# Patient Record
Sex: Male | Born: 1960
Health system: Southern US, Community
[De-identification: ages and names within clinical notes are randomized; demographics above are authoritative.]

## PROBLEM LIST (undated history)

## (undated) DIAGNOSIS — F40240 Claustrophobia: Secondary | ICD-10-CM

## (undated) DIAGNOSIS — F411 Generalized anxiety disorder: Secondary | ICD-10-CM

## (undated) DIAGNOSIS — M169 Osteoarthritis of hip, unspecified: Secondary | ICD-10-CM

## (undated) HISTORY — DX: Claustrophobia: F40.240

## (undated) HISTORY — DX: Osteoarthritis of hip, unspecified: M16.9

## (undated) HISTORY — DX: Generalized anxiety disorder: F41.1

---

## 2004-01-14 ENCOUNTER — Ambulatory Visit: Payer: Self-pay | Admitting: Internal Medicine

## 2004-01-21 ENCOUNTER — Ambulatory Visit: Payer: Self-pay | Admitting: Internal Medicine

## 2004-07-16 ENCOUNTER — Ambulatory Visit: Payer: Self-pay | Admitting: Internal Medicine

## 2004-12-01 ENCOUNTER — Ambulatory Visit: Payer: Self-pay | Admitting: Internal Medicine

## 2006-06-10 ENCOUNTER — Ambulatory Visit: Payer: Self-pay | Admitting: Internal Medicine

## 2006-06-17 ENCOUNTER — Ambulatory Visit: Payer: Self-pay | Admitting: Internal Medicine

## 2006-12-09 ENCOUNTER — Ambulatory Visit: Payer: Self-pay | Admitting: Internal Medicine

## 2007-08-21 ENCOUNTER — Telehealth: Payer: Self-pay | Admitting: Internal Medicine

## 2007-09-26 ENCOUNTER — Encounter: Payer: Self-pay | Admitting: Internal Medicine

## 2008-09-03 ENCOUNTER — Telehealth: Payer: Self-pay | Admitting: Internal Medicine

## 2008-12-17 ENCOUNTER — Ambulatory Visit: Payer: Self-pay | Admitting: Internal Medicine

## 2008-12-17 DIAGNOSIS — M1610 Unilateral primary osteoarthritis, unspecified hip: Secondary | ICD-10-CM

## 2008-12-17 DIAGNOSIS — F411 Generalized anxiety disorder: Secondary | ICD-10-CM | POA: Insufficient documentation

## 2008-12-17 DIAGNOSIS — M169 Osteoarthritis of hip, unspecified: Secondary | ICD-10-CM | POA: Insufficient documentation

## 2008-12-17 HISTORY — DX: Osteoarthritis of hip, unspecified: M16.9

## 2008-12-17 HISTORY — DX: Unilateral primary osteoarthritis, unspecified hip: M16.10

## 2008-12-17 HISTORY — DX: Generalized anxiety disorder: F41.1

## 2008-12-19 ENCOUNTER — Encounter (INDEPENDENT_AMBULATORY_CARE_PROVIDER_SITE_OTHER): Payer: Self-pay | Admitting: *Deleted

## 2009-01-20 ENCOUNTER — Encounter: Payer: Self-pay | Admitting: Internal Medicine

## 2009-02-19 ENCOUNTER — Telehealth: Payer: Self-pay | Admitting: Internal Medicine

## 2009-02-19 ENCOUNTER — Ambulatory Visit: Payer: Self-pay | Admitting: Internal Medicine

## 2009-02-19 LAB — CONVERTED CEMR LAB: Vit D, 25-Hydroxy: 33 ng/mL (ref 30–89)

## 2009-02-20 ENCOUNTER — Ambulatory Visit: Payer: Self-pay | Admitting: Internal Medicine

## 2009-04-10 ENCOUNTER — Encounter: Payer: Self-pay | Admitting: Internal Medicine

## 2009-12-01 ENCOUNTER — Telehealth: Payer: Self-pay | Admitting: Internal Medicine

## 2009-12-10 ENCOUNTER — Ambulatory Visit: Payer: Self-pay | Admitting: Internal Medicine

## 2010-03-08 LAB — CONVERTED CEMR LAB
ALT: 22 units/L (ref 0–40)
ALT: 23 units/L (ref 0–53)
AST: 24 units/L (ref 0–37)
AST: 27 units/L (ref 0–37)
Albumin: 4.1 g/dL (ref 3.5–5.2)
Albumin: 4.2 g/dL (ref 3.5–5.2)
Alkaline Phosphatase: 50 units/L (ref 39–117)
Alkaline Phosphatase: 53 units/L (ref 39–117)
BUN: 11 mg/dL (ref 6–23)
BUN: 6 mg/dL (ref 6–23)
Basophils Absolute: 0 10*3/uL (ref 0.0–0.1)
Basophils Absolute: 0 10*3/uL (ref 0.0–0.1)
Basophils Relative: 0.2 % (ref 0.0–1.0)
Basophils Relative: 0.4 % (ref 0.0–3.0)
Bilirubin, Direct: 0 mg/dL (ref 0.0–0.3)
Bilirubin, Direct: 0.1 mg/dL (ref 0.0–0.3)
CO2: 30 meq/L (ref 19–32)
CO2: 32 meq/L (ref 19–32)
Calcium: 9.3 mg/dL (ref 8.4–10.5)
Calcium: 9.5 mg/dL (ref 8.4–10.5)
Chloride: 105 meq/L (ref 96–112)
Chloride: 107 meq/L (ref 96–112)
Cholesterol: 154 mg/dL (ref 0–200)
Cholesterol: 192 mg/dL (ref 0–200)
Creatinine, Ser: 0.9 mg/dL (ref 0.4–1.5)
Creatinine, Ser: 1 mg/dL (ref 0.4–1.5)
Eosinophils Absolute: 0.1 10*3/uL (ref 0.0–0.7)
Eosinophils Absolute: 0.2 10*3/uL (ref 0.0–0.6)
Eosinophils Relative: 1.5 % (ref 0.0–5.0)
Eosinophils Relative: 3.8 % (ref 0.0–5.0)
GFR calc Af Amer: 103 mL/min
GFR calc non Af Amer: 85 mL/min
GFR calc non Af Amer: 94.95 mL/min (ref >60)
Glucose, Bld: 80 mg/dL (ref 70–99)
Glucose, Bld: 92 mg/dL (ref 70–99)
HCT: 41.6 % (ref 39.0–52.0)
HCT: 41.9 % (ref 39.0–52.0)
HDL: 32.5 mg/dL — ABNORMAL LOW (ref >39.00)
HDL: 44.2 mg/dL (ref >39.0)
Hemoglobin: 14.2 g/dL (ref 13.0–17.0)
Hemoglobin: 14.4 g/dL (ref 13.0–17.0)
INR: 1.1 — ABNORMAL HIGH (ref 0.8–1.0)
LDL Cholesterol: 111 mg/dL — ABNORMAL HIGH (ref 0–99)
LDL Cholesterol: 129 mg/dL — ABNORMAL HIGH (ref 0–99)
Lymphocytes Relative: 32.8 % (ref 12.0–46.0)
Lymphocytes Relative: 33.1 % (ref 12.0–46.0)
Lymphs Abs: 1.8 10*3/uL (ref 0.7–4.0)
MCHC: 33.9 g/dL (ref 30.0–36.0)
MCHC: 34.6 g/dL (ref 30.0–36.0)
MCV: 85.5 fL (ref 78.0–100.0)
MCV: 90.3 fL (ref 78.0–100.0)
Monocytes Absolute: 0.6 10*3/uL (ref 0.1–1.0)
Monocytes Absolute: 0.7 10*3/uL (ref 0.2–0.7)
Monocytes Relative: 10.8 % (ref 3.0–12.0)
Monocytes Relative: 10.9 % (ref 3.0–11.0)
Neutro Abs: 3 10*3/uL (ref 1.4–7.7)
Neutro Abs: 3.4 10*3/uL (ref 1.4–7.7)
Neutrophils Relative %: 52 % (ref 43.0–77.0)
Neutrophils Relative %: 54.5 % (ref 43.0–77.0)
Platelets: 214 10*3/uL (ref 150.0–400.0)
Platelets: 253 10*3/uL (ref 150–400)
Potassium: 4 meq/L (ref 3.5–5.1)
Potassium: 4.3 meq/L (ref 3.5–5.1)
Prothrombin Time: 11.1 s (ref 9.1–11.7)
RBC: 4.63 M/uL (ref 4.22–5.81)
RBC: 4.86 M/uL (ref 4.22–5.81)
RDW: 12.5 % (ref 11.5–14.6)
RDW: 12.9 % (ref 11.5–14.6)
Sodium: 144 meq/L (ref 135–145)
Sodium: 146 meq/L — ABNORMAL HIGH (ref 135–145)
TSH: 3.2 microintl units/mL (ref 0.35–5.50)
TSH: 3.35 microintl units/mL (ref 0.35–5.50)
Total Bilirubin: 0.9 mg/dL (ref 0.3–1.2)
Total Bilirubin: 1.2 mg/dL (ref 0.3–1.2)
Total CHOL/HDL Ratio: 4.3
Total CHOL/HDL Ratio: 5
Total Protein: 7.1 g/dL (ref 6.0–8.3)
Total Protein: 7.6 g/dL (ref 6.0–8.3)
Triglycerides: 55 mg/dL (ref 0.0–149.0)
Triglycerides: 94 mg/dL (ref 0–149)
VLDL: 11 mg/dL (ref 0.0–40.0)
VLDL: 19 mg/dL (ref 0–40)
WBC: 5.5 10*3/uL (ref 4.5–10.5)
WBC: 6.5 10*3/uL (ref 4.5–10.5)
aPTT: 28.9 s — ABNORMAL HIGH (ref 21.7–28.8)

## 2010-03-10 NOTE — Letter (Signed)
Summary: [facility]   Imported By: Maryln Gottron 04/22/2009 12:29:27  _____________________________________________________________________  External Attachment:    Type:   Image     Comment:   External Document

## 2010-03-10 NOTE — Progress Notes (Signed)
Summary: question about wife  Phone Note Call from Patient Call back at Home Phone [phone]   Caller: husband,Cormick Summary of Call: Would you see wife, Sergio Freeman, [birth year], for elevated liver test done for life insurance or your suggestion?  He thought you were also her PCP. Initial call taken by: Rudy Jew, RN,  December 01, 2009 8:34 AM  Follow-up for Phone Call        ok next available Follow-up by: Birdie Sons MD,  December 01, 2009 3:52 PM  Additional Follow-up for Phone Call Additional follow up Details #1::        Left message to inform.   Additional Follow-up by: Rudy Jew, RN,  December 01, 2009 4:33 PM

## 2010-03-10 NOTE — Procedures (Signed)
Summary: Colonoscopy Report/[facility]  Colonoscopy Report/[facility]   Imported By: Maryln Gottron 12/19/2009 13:47:26  _____________________________________________________________________  External Attachment:    Type:   Image     Comment:   External Document

## 2010-03-10 NOTE — Progress Notes (Signed)
Summary: questions regarding Advil?  Phone Note Call from Patient   Caller: Patient Call For: Dr Cato Mulligan Summary of Call: Pt. saw a MD for his osteoarthritis of hip, and was advised to take Advil 3 in the am and 3 at lunch.....Marland KitchenMarland KitchenHe wants to know if you feel this is too much Advil or if it would be ok to take this amount of Advil. [phone] Initial call taken by: Lynann Beaver CMA,  August 21, 2007 2:37 PM  Follow-up for Phone Call        ok to take as prescribed by ortho Follow-up by: Birdie Sons MD,  August 21, 2007 3:41 PM  Additional Follow-up for Phone Call Additional follow up Details #1::        Give Dr. Marliss Coots recommendations. Additional Follow-up by: Lynann Beaver CMA,  August 21, 2007 3:46 PM

## 2010-03-10 NOTE — Assessment & Plan Note (Signed)
Summary: LOWER ABD PAIN/RCD   Vital Signs:  Patient profile:   50 year old male Height:      70 inches Weight:      179 pounds BMI:     25.78 Temp:     99.1 degrees F oral BP sitting:   164 / 102  (left arm) Cuff size:   regular  Vitals Entered By: Alfred Levins, CMA (December 10, 2009 8:35 AM)  Serial Vital Signs/Assessments:  Time      Position  BP       Pulse  Resp  Temp     By                     138/82                         Birdie Sons MD  CC: lower abd pain x4 days, no nausea   CC:  lower abd pain x4 days and no nausea.  History of Present Illness: Bilateral lower quadrant pain.  Crampy abdominal pain for 4-5 days BMS ok but less than usual no diarrhea.no nausea/emesis no fever, chills,  no unusual foods but was in [place] --- came back 11/29/09. Was staying at a resort.   Appetite has been diminished.   Current Medications (verified): 1)  None  Allergies (verified): No Known Drug Allergies  Past History:  Past Medical History: Last updated: 12/17/2008 Unremarkable Anxiety---situational claustrophobia  Past Surgical History: Last updated: 12/17/2008 Denies surgical history  Family History: Last updated: 12/17/2008 father---alive, MI, CHF mother healthy  Social History: Last updated: 12/17/2008 Occupation: Married Never Smoked Regular exercise-yes  Risk Factors: Exercise: yes (12/17/2008)  Risk Factors: Smoking Status: never (12/17/2008)  Physical Exam  General:  well-developed well-nourished male in no acute distress. HEENT exam atraumatic, normocephalic symmetric her muscles are intact. No icterus. Neck is supple. Chest clear to auscultation cardiac exam S1-S2 are regular. Abdominal exam active bowel sounds, soft. Nondistended. Tenderness to palpation in the left lower quadrant. No rebound or guarding. Extremities with no clubbing cyanosis or edema.   Impression & Recommendations:  Problem # 1:  DIVERTICULITIS, COLON, NOS  (ICD-562.11)  based on history exam unconvinced the patient has diverticulitis. Will treat as such. He'll take Cipro and metronidazole. Side effects discussed. He'll avoid alcohol. He'll call me if he develops any fever, chills worsening pain. Natural course of diverticulitis discussed. Worsening symptoms of diverticulitis discussed.  Complete Medication List: 1)  Cipro 500 Mg Tabs (Ciprofloxacin hcl) .Marland Kitchen.. 1 by mouth 2 times daily 2)  Metronidazole 500 Mg Tabs (Metronidazole) .... Take 1 tablet by mouth three times a day  Patient Instructions: 1)  no alcohol while taking metronidazole Prescriptions: METRONIDAZOLE 500 MG  TABS (METRONIDAZOLE) Take 1 tablet by mouth three times a day  #15 x 0   Entered and Authorized by:   Birdie Sons MD   Signed by:   Birdie Sons MD on 12/10/2009   Method used:   Electronically to        [facility] [phone]* (retail)       [address] [place], [place]  [address]       Ph: [phone]       Fax: [phone]   RxID:   [id number] CIPRO 500 MG TABS (CIPROFLOXACIN HCL) 1 by mouth 2 times daily  #14 x 0   Entered and Authorized by:   Smitty Cords  Swords MD   Signed by:   Birdie Sons MD on 12/10/2009   Method used:   Electronically to        [facility] # [phone]* (retail)       [address] [place], [place]  [address]       Ph: [phone]       Fax: [phone]   RxID:   [id number]    Orders Added: 1)  Est. Patient Level IV [244[address]]

## 2010-03-10 NOTE — Progress Notes (Signed)
  Phone Note Call from Patient   Caller: Patient Call For: Birdie Sons MD Summary of Call: Pt and his son are going on a cruise, and would like to have #4 Transderm Scop Patches. [facility] [phone] Initial call taken by: Lynann Beaver CMA,  September 03, 2008 11:11 AM  Follow-up for Phone Call        ok, change every 3 days Follow-up by: Birdie Sons MD,  September 04, 2008 8:22 AM    New/Updated Medications: TRANSDERM-SCOP 1.5 MG PT72 (SCOPOLAMINE BASE) apply q 3 days Prescriptions: TRANSDERM-SCOP 1.5 MG PT72 (SCOPOLAMINE BASE) apply q 3 days  #4 x 0   Entered by:   Lynann Beaver CMA   Authorized by:   Birdie Sons MD   Signed by:   Lynann Beaver CMA on 09/04/2008   Method used:   [id number] ([facility]ail[facility]       [address]       [place], [place]  [address]       Ph: [phone] or [phone]       Fax: [phone]   RxID:   [id number] TRANSDERM-SCOP 1.5 MG PT72 (SCOPOLAMINE BASE) apply q 3 days  #4 x 0   Entered by:   Lynann Beaver CMA   Authorized by:   Birdie Sons MD   Signed by:   Lynann Beaver CMA on 09/04/2008   Method used:   Print then Give to Patient   RxID:   [id number]  Pt. notified.

## 2010-03-10 NOTE — Consult Note (Signed)
[facility]   Imported By: Kassie Mends 10/05/2007 14:18:15  _____________________________________________________________________  External Attachment:    Type:   Image     Comment:   [facility]

## 2010-03-10 NOTE — Letter (Signed)
Summary: Previsit letter  [facility]  [address] [place], [place] [address]   Phone: [phone]  Fax: [phone]       12/19/2008 MRN: [id number]  [facility] [address] [place], [place]  [address]  Dear Sergio Freeman,  Welcome to the Gastroenterology Division at [facility].    You are scheduled to see a nurse for your pre-procedure visit on 01/01/2009 at 8:30PM on the 3rd floor at [facility], [address]  We ask that you try to arrive at our office 15 minutes prior to your appointment time to allow for check-in.  Your nurse visit will consist of discussing your medical and surgical history, your immediate family medical history, and your medications.    Please bring a complete list of all your medications or, if you prefer, bring the medication bottles and we will list them.  We will need to be aware of both prescribed and over the counter drugs.  We will need to know exact dosage information as well.  If you are on blood thinners (Coumadin, Plavix, Aggrenox, Ticlid, etc.) please call our office today/prior to your appointment, as we need to consult with your physician about holding your medication.   Please be prepared to read and sign documents such as consent forms, a financial agreement, and acknowledgement forms.  If necessary, and with your consent, a friend or relative is welcome to sit-in on the nurse visit with you.  Please bring your insurance card so that we may make a copy of it.  If your insurance requires a referral to see a specialist, please bring your referral form from your primary care physician.  No co-pay is required for this nurse visit.     If you cannot keep your appointment, please call [phone] to cancel or reschedule prior to your appointment date.  This allows [place] the opportunity to schedule an appointment for another patient in need of care.    Thank you for choosing [facility] for your medical  needs.  We appreciate the opportunity to care for you.  Please visit [place] at our website  to learn more about our practice.                     Sincerely.                                                                                                                   The Gastroenterology Division

## 2010-03-10 NOTE — Assessment & Plan Note (Signed)
Summary: MEDICAL CLEARANCE FOR HIP SURGERY/NJR/discuss colonoscopy ref...   Vital Signs:  Patient profile:   50 year old male Weight:      176 pounds Temp:     98.2 degrees F Pulse rate:   64 / minute Resp:     12 per minute BP sitting:   118 / 78  (left arm)  Vitals Entered By: Gladis Riffle, RN (December 17, 2008 9:09 AM)   History of Present Illness: preop clearance--- left hip---ongoing pain for years---progressive. Scheduled 02/27/08  All other systems reviewed and were negative   Preventive Screening-Counseling & Management  Alcohol-Tobacco     Smoking Status: never  Caffeine-Diet-Exercise     Does Patient Exercise: yes  Current Problems (verified): None  Current Medications (verified): 1)  None  Allergies (verified): No Known Drug Allergies  Comments:  Nurse/Medical Assistant:   requests cpx, is fastingfor medical clearance for left hip surgery by dr gross on 02/26/09--also wants to discuss colonoscopy  The patient's medications and allergies were reviewed with the patient and were updated in the Medication and Allergy Lists. Gladis Riffle, RN (December 17, 2008 9:11 AM)  Past History:  Past Medical History: Unremarkable Anxiety---situational claustrophobia  Past Surgical History: Denies surgical history  Family History: father---alive, MI, CHF mother healthy  Social History: Occupation: Married Never Smoked Regular exercise-yes Smoking Status:  never Does Patient Exercise:  yes  Review of Systems       All other systems reviewed and were negative   Physical Exam  General:  Well-developed,well-nourished,in no acute distress; alert,appropriate and cooperative throughout examination Head:  normocephalic and atraumatic.   Eyes:  pupils equal and pupils round.   Ears:  R ear normal and L ear normal.   Nose:  no external deformity and no external erythema.   Neck:  No deformities, masses, or tenderness noted. Chest Wall:  No deformities,  masses, tenderness or gynecomastia noted. Lungs:  normal respiratory effort and no intercostal retractions.   Heart:  normal rate, regular rhythm, no murmur, and no gallop.   Abdomen:  Bowel sounds positive,abdomen soft and non-tender without masses, organomegaly or hernias noted. Msk:  from ofright hip left hip with decreased internal and external rotation.  Pulses:  R radial normal and L radial normal.   Neurologic:  cranial nerves II-XII intact and gait normal.     Impression & Recommendations:  Problem # 1:  PREOPERATIVE EXAMINATION (ICD-V72.84)  he is at low risk for general anesthesia  Orders: Venipuncture ([address]) TLB-BMP (Basic Metabolic Panel-BMET) (80048-METABOL) TLB-CBC Platelet - w/Differential (85025-CBCD) TLB-Hepatic/Liver Function Pnl (80076-HEPATIC) TLB-TSH (Thyroid Stimulating Hormone) (84443-TSH) TLB-PTT (85730-PTTL) TLB-PT (Protime) (85610-PTP)  Problem # 2:  ANXIETY (ICD-300.00) situational His updated medication list for this problem includes:    Alprazolam 0.5 Mg Tabs (Alprazolam) .Marland Kitchen... 1/2-1 by mouth as needed anxiety  Complete Medication List: 1)  Alprazolam 0.5 Mg Tabs (Alprazolam) .... 1/2-1 by mouth as needed anxiety 2)  Ciclopirox 8 % Soln (Ciclopirox) .... Apply two times a day to affected toe  Other Orders: EKG w/ Interpretation (93000) Gastroenterology Referral (GI) Prescriptions: CICLOPIROX 8 % SOLN (CICLOPIROX) apply two times a day to affected toe  #1 vial x 1   Entered and Authorized by:   Birdie Sons MD   Signed by:   Birdie Sons MD on 12/17/2008   Method used:   [facility]ron[facility] to        [facility] [phone]* (retail)       [phone]  [place], [place]  [address]       Ph: [phone]       Fax: [phone]   RxID:   [id number] ALPRAZOLAM 0.5 MG  TABS (ALPRAZOLAM) 1/2-1 by mouth as needed anxiety  #10 x 0   Entered and Authorized by:   Birdie Sons MD   Signed by:   Birdie Sons MD on 12/17/2008   Method  used:   Print then Give to Patient   RxID:   [id number]   Appended Document: MEDICAL CLEARANCE FOR HIP SURGERY/NJR/discuss colonoscopy ref...   Appended Document: MEDICAL CLEARANCE FOR HIP SURGERY/NJR/discuss colonoscopy ref.Marland KitchenMarland Kitchen

## 2010-03-10 NOTE — Progress Notes (Signed)
Summary: medical clearance  Phone Note Call from Patient   Caller: Patient Call For: Birdie Sons MD Summary of Call: Pt is calling to check on Surgical Clearance.  Wants to pick it up today ASAP as surgeons office states they have called multiple times and have not received. it. [phone] Initial call taken by: Lynann Beaver CMA,  February 19, 2009 12:00 PM  Follow-up for Phone Call        medical records is working on this.  Patient notified.  Follow-up by: Gladis Riffle, RN,  February 19, 2009 12:27 PM  Additional Follow-up for Phone Call Additional follow up Details #1::        Dr Michaell Cowing office says pt must have Vit D level done before surgery.  Left message on pt home phone to call back for lab appt for Vit D level.  He may call me back if any questions. Additional Follow-up by: Gladis Riffle, RN,  February 19, 2009 3:42 PM    Additional Follow-up for Phone Call Additional follow up Details #2::    Vit D sceduled.  Also needs cxr so that was ordered also.Patient notified..  These will need to  be faxed to Dr Michaell Cowing as soon as returned. Follow-up by: Gladis Riffle, RN,  February 19, 2009 4:04 PM  Additional Follow-up for Phone Call Additional follow up Details #3:: Details for Additional Follow-up Action Taken: Patient notified.  Additional Follow-up by: Gladis Riffle, RN,  February 21, 2009 8:01 AM

## 2010-03-10 NOTE — Assessment & Plan Note (Signed)
Summary: GROIN PAIN/MHF    Vital Signs:  Patient Profile:   50 Years Old Male Weight:      174 pounds Temp:     97.6 degrees F oral BP sitting:   146 / 84  Vitals Entered By: Lynann Beaver CMA (December 09, 2006 11:56 AM)                 Chief Complaint:  right groin pain x 3 weeks.  History of Present Illness: exercising a lot has noticed Right testicular pain--can radiate to right groin. has a long history of left hip arthritis---has been tole at some time he will need a hip replacement        Review of Systems       no other complaints in a complete ROS    Physical Exam  General:     Well-developed,well-nourished,in no acute distress; alert,appropriate and cooperative throughout examination Neck:     No deformities, masses, or tenderness noted. Chest Wall:     No deformities, masses, tenderness or gynecomastia noted. Abdomen:     Bowel sounds positive,abdomen soft and non-tender without masses, organomegaly or hernias noted. Genitalia:     no hernia Msk:     from ofright hip left hip with decreased internal and external rotation.     Impression & Recommendations:  Problem # 1:  INGUINAL PAIN, RIGHT (ICD-789.09) no obvious source. he has a remarkably arthritic left hip     ]

## 2010-12-10 ENCOUNTER — Encounter: Payer: Self-pay | Admitting: Internal Medicine

## 2010-12-11 ENCOUNTER — Ambulatory Visit: Payer: Self-pay | Admitting: Family Medicine

## 2010-12-21 ENCOUNTER — Telehealth: Payer: Self-pay | Admitting: *Deleted

## 2010-12-21 NOTE — Telephone Encounter (Signed)
Pt aware.

## 2010-12-21 NOTE — Telephone Encounter (Signed)
Pt was experiencing Tinnitus and numbness.  He went to an ENT and was told he had Meniere's disease.  Pt wants to know if he should come in and see Dr Cato Mulligan or if not, what he should do next

## 2010-12-21 NOTE — Telephone Encounter (Signed)
Not much to do with menniere's---continue f/u with ENT would be best bet

## 2011-01-27 ENCOUNTER — Ambulatory Visit: Payer: Self-pay | Admitting: Internal Medicine

## 2011-01-27 ENCOUNTER — Encounter: Payer: Self-pay | Admitting: Internal Medicine

## 2011-01-27 ENCOUNTER — Ambulatory Visit (INDEPENDENT_AMBULATORY_CARE_PROVIDER_SITE_OTHER): Payer: BC Managed Care – PPO | Admitting: Internal Medicine

## 2011-01-27 VITALS — BP 132/90 | HR 80 | Temp 98.2°F | Ht 70.0 in | Wt 176.0 lb

## 2011-01-27 DIAGNOSIS — H8109 Meniere's disease, unspecified ear: Secondary | ICD-10-CM

## 2011-01-27 DIAGNOSIS — H9319 Tinnitus, unspecified ear: Secondary | ICD-10-CM

## 2011-01-27 DIAGNOSIS — Z0000 Encounter for general adult medical examination without abnormal findings: Secondary | ICD-10-CM

## 2011-01-27 DIAGNOSIS — R42 Dizziness and giddiness: Secondary | ICD-10-CM

## 2011-01-27 DIAGNOSIS — F419 Anxiety disorder, unspecified: Secondary | ICD-10-CM

## 2011-01-27 DIAGNOSIS — F411 Generalized anxiety disorder: Secondary | ICD-10-CM

## 2011-01-27 LAB — CBC WITH DIFFERENTIAL/PLATELET
Basophils Absolute: 0 10*3/uL (ref 0.0–0.1)
Basophils Relative: 0.3 % (ref 0.0–3.0)
Eosinophils Absolute: 0 10*3/uL (ref 0.0–0.7)
Eosinophils Relative: 0.3 % (ref 0.0–5.0)
HCT: 47 % (ref 39.0–52.0)
Hemoglobin: 15.9 g/dL (ref 13.0–17.0)
Lymphocytes Relative: 19.6 % (ref 12.0–46.0)
Lymphs Abs: 1.2 10*3/uL (ref 0.7–4.0)
MCHC: 33.9 g/dL (ref 30.0–36.0)
MCV: 89.7 fl (ref 78.0–100.0)
Monocytes Absolute: 0.8 10*3/uL (ref 0.1–1.0)
Monocytes Relative: 13.3 % — ABNORMAL HIGH (ref 3.0–12.0)
Neutro Abs: 3.9 10*3/uL (ref 1.4–7.7)
Neutrophils Relative %: 66.5 % (ref 43.0–77.0)
Platelets: 215 10*3/uL (ref 150.0–400.0)
RBC: 5.23 Mil/uL (ref 4.22–5.81)
RDW: 14 % (ref 11.5–14.6)
WBC: 5.9 10*3/uL (ref 4.5–10.5)

## 2011-01-27 LAB — LIPID PANEL
Cholesterol: 209 mg/dL — ABNORMAL HIGH (ref 0–200)
HDL: 57 mg/dL (ref >39.00)
Total CHOL/HDL Ratio: 4
Triglycerides: 52 mg/dL (ref 0.0–149.0)
VLDL: 10.4 mg/dL (ref 0.0–40.0)

## 2011-01-27 LAB — HEPATIC FUNCTION PANEL
ALT: 27 U/L (ref 0–53)
AST: 30 U/L (ref 0–37)
Albumin: 4.6 g/dL (ref 3.5–5.2)
Alkaline Phosphatase: 60 U/L (ref 39–117)
Bilirubin, Direct: 0.1 mg/dL (ref 0.0–0.3)
Total Bilirubin: 0.7 mg/dL (ref 0.3–1.2)
Total Protein: 7.8 g/dL (ref 6.0–8.3)

## 2011-01-27 LAB — BASIC METABOLIC PANEL
BUN: 14 mg/dL (ref 6–23)
CO2: 29 mEq/L (ref 19–32)
Calcium: 9.9 mg/dL (ref 8.4–10.5)
Chloride: 105 mEq/L (ref 96–112)
Creatinine, Ser: 1.2 mg/dL (ref 0.4–1.5)
GFR: 67.56 mL/min (ref >60.00)
Glucose, Bld: 86 mg/dL (ref 70–99)
Potassium: 5.5 mEq/L — ABNORMAL HIGH (ref 3.5–5.1)
Sodium: 142 mEq/L (ref 135–145)

## 2011-01-27 LAB — LDL CHOLESTEROL, DIRECT: Direct LDL: 139.2 mg/dL

## 2011-01-27 LAB — TSH: TSH: 2.27 u[IU]/mL (ref 0.35–5.50)

## 2011-01-27 LAB — PSA: PSA: 0.69 ng/mL (ref 0.10–4.00)

## 2011-01-27 MED ORDER — ALPRAZOLAM 0.25 MG PO TABS: 0.2500 mg | ORAL_TABLET | Freq: Every day | ORAL | Status: AC | PRN

## 2011-01-27 NOTE — Progress Notes (Signed)
Patient ID: Sergio Freeman, male   DOB: August 20, 1960, 50 y.o.   MRN: [id number] Meniere's syndrome-- sees ENT.  He has developed a lot of anxiety about the diagnosis of Meniere's syndrome. He has spent a lot of time reading about hearing loss, and vertigo. His sxs are quite mild. He has had 4 episodes of vertigo over the past 3 years.   A/P: Meniere's syndrome Discussed with patient and wife. I have encouraged them not to pursue surgical options. I have tried to reassure them. Total time 20 minutes all counselling

## 2011-02-03 ENCOUNTER — Other Ambulatory Visit: Payer: Self-pay | Admitting: Internal Medicine

## 2011-02-03 ENCOUNTER — Other Ambulatory Visit (INDEPENDENT_AMBULATORY_CARE_PROVIDER_SITE_OTHER): Payer: BC Managed Care – PPO

## 2011-02-03 DIAGNOSIS — H8109 Meniere's disease, unspecified ear: Secondary | ICD-10-CM | POA: Insufficient documentation

## 2011-02-03 DIAGNOSIS — E875 Hyperkalemia: Secondary | ICD-10-CM

## 2011-02-03 LAB — BASIC METABOLIC PANEL
BUN: 11 mg/dL (ref 6–23)
CO2: 31 mEq/L (ref 19–32)
Calcium: 9.1 mg/dL (ref 8.4–10.5)
Chloride: 103 mEq/L (ref 96–112)
Creatinine, Ser: 1 mg/dL (ref 0.4–1.5)
GFR: 84.34 mL/min (ref >60.00)
Glucose, Bld: 139 mg/dL — ABNORMAL HIGH (ref 70–99)
Potassium: 4 mEq/L (ref 3.5–5.1)
Sodium: 141 mEq/L (ref 135–145)

## 2011-02-24 ENCOUNTER — Telehealth: Payer: Self-pay | Admitting: *Deleted

## 2011-02-24 DIAGNOSIS — R739 Hyperglycemia, unspecified: Secondary | ICD-10-CM

## 2011-02-24 NOTE — Telephone Encounter (Signed)
Pt picked up copies of labs and he noticed his gluclose was 139.  He was fasting at time of blood draw.

## 2011-02-24 NOTE — Telephone Encounter (Signed)
Repeat labs in 2 months-- i've placed orders

## 2011-02-25 NOTE — Telephone Encounter (Signed)
Pt aware.

## 2011-03-23 ENCOUNTER — Other Ambulatory Visit: Payer: Self-pay | Admitting: Orthopedic Surgery

## 2011-03-23 DIAGNOSIS — M542 Cervicalgia: Secondary | ICD-10-CM

## 2011-03-29 ENCOUNTER — Other Ambulatory Visit: Payer: BC Managed Care – PPO

## 2011-06-28 ENCOUNTER — Telehealth: Payer: Self-pay | Admitting: Internal Medicine

## 2011-06-28 DIAGNOSIS — Z0000 Encounter for general adult medical examination without abnormal findings: Secondary | ICD-10-CM

## 2011-06-28 NOTE — Telephone Encounter (Signed)
Pt called and said that he never came in to get repeat labs done. Pt said that he is urinating more frequently in the last 3 wks or so. Pt has been off diuretic for 3 months. Pt req call back from nurse. Should pt still come in for repeat labs or should pt get additional labs done?

## 2011-06-28 NOTE — Telephone Encounter (Signed)
Pt should come for labs

## 2011-06-29 NOTE — Telephone Encounter (Signed)
Called pt and schd him for labs tomorrow morning at 9am. Pt is also wanting to get cholesterol lvl checked. Pls order additional lab.

## 2011-06-29 NOTE — Telephone Encounter (Signed)
Future orders placed 

## 2011-06-30 ENCOUNTER — Other Ambulatory Visit (INDEPENDENT_AMBULATORY_CARE_PROVIDER_SITE_OTHER): Payer: BC Managed Care – PPO

## 2011-06-30 DIAGNOSIS — R7309 Other abnormal glucose: Secondary | ICD-10-CM

## 2011-06-30 DIAGNOSIS — Z0000 Encounter for general adult medical examination without abnormal findings: Secondary | ICD-10-CM

## 2011-06-30 DIAGNOSIS — E785 Hyperlipidemia, unspecified: Secondary | ICD-10-CM

## 2011-06-30 DIAGNOSIS — Z13220 Encounter for screening for lipoid disorders: Secondary | ICD-10-CM

## 2011-06-30 DIAGNOSIS — R739 Hyperglycemia, unspecified: Secondary | ICD-10-CM

## 2011-06-30 LAB — LIPID PANEL
Cholesterol: 220 mg/dL — ABNORMAL HIGH (ref 0–200)
HDL: 50.4 mg/dL (ref >39.00)
Total CHOL/HDL Ratio: 4
Triglycerides: 98 mg/dL (ref 0.0–149.0)
VLDL: 19.6 mg/dL (ref 0.0–40.0)

## 2011-06-30 LAB — BASIC METABOLIC PANEL
BUN: 14 mg/dL (ref 6–23)
CO2: 29 mEq/L (ref 19–32)
Calcium: 9.5 mg/dL (ref 8.4–10.5)
Chloride: 105 mEq/L (ref 96–112)
Creatinine, Ser: 1 mg/dL (ref 0.4–1.5)
GFR: 87.26 mL/min (ref >60.00)
Glucose, Bld: 89 mg/dL (ref 70–99)
Potassium: 4.1 mEq/L (ref 3.5–5.1)
Sodium: 141 mEq/L (ref 135–145)

## 2011-06-30 LAB — HEPATIC FUNCTION PANEL
ALT: 23 U/L (ref 0–53)
AST: 21 U/L (ref 0–37)
Albumin: 4.2 g/dL (ref 3.5–5.2)
Alkaline Phosphatase: 50 U/L (ref 39–117)
Bilirubin, Direct: 0 mg/dL (ref 0.0–0.3)
Total Bilirubin: 0.8 mg/dL (ref 0.3–1.2)
Total Protein: 7.4 g/dL (ref 6.0–8.3)

## 2011-06-30 LAB — HEMOGLOBIN A1C: Hgb A1c MFr Bld: 5.8 % (ref 4.6–6.5)

## 2011-06-30 LAB — LDL CHOLESTEROL, DIRECT: Direct LDL: 157.5 mg/dL

## 2011-07-07 ENCOUNTER — Telehealth: Payer: Self-pay | Admitting: Internal Medicine

## 2011-07-07 NOTE — Telephone Encounter (Signed)
Pt needs blood work results °

## 2011-07-07 NOTE — Telephone Encounter (Signed)
Pt is having freq urination.  Put pt on lab schedule for tomorrow.

## 2011-07-08 ENCOUNTER — Other Ambulatory Visit (INDEPENDENT_AMBULATORY_CARE_PROVIDER_SITE_OTHER): Payer: BC Managed Care – PPO

## 2011-07-08 DIAGNOSIS — R350 Frequency of micturition: Secondary | ICD-10-CM

## 2011-07-08 LAB — POCT URINALYSIS DIPSTICK
Bilirubin, UA: NEGATIVE
Blood, UA: NEGATIVE
Glucose, UA: NEGATIVE
Ketones, UA: NEGATIVE
Leukocytes, UA: NEGATIVE
Nitrite, UA: NEGATIVE
Protein, UA: NEGATIVE
Spec Grav, UA: 1.02
Urobilinogen, UA: 0.2
pH, UA: 6

## 2011-07-10 LAB — URINE CULTURE
Colony Count: NO GROWTH
Organism ID, Bacteria: NO GROWTH

## 2011-09-30 ENCOUNTER — Other Ambulatory Visit: Payer: Self-pay | Admitting: Chiropractic Medicine

## 2011-09-30 DIAGNOSIS — M542 Cervicalgia: Secondary | ICD-10-CM

## 2011-10-04 ENCOUNTER — Other Ambulatory Visit: Payer: BC Managed Care – PPO

## 2012-02-25 ENCOUNTER — Encounter: Payer: Self-pay | Admitting: Family Medicine

## 2012-02-25 ENCOUNTER — Ambulatory Visit (INDEPENDENT_AMBULATORY_CARE_PROVIDER_SITE_OTHER): Payer: BC Managed Care – PPO | Admitting: Family Medicine

## 2012-02-25 VITALS — BP 130/90 | Temp 98.3°F | Wt 188.0 lb

## 2012-02-25 DIAGNOSIS — R05 Cough: Secondary | ICD-10-CM

## 2012-02-25 DIAGNOSIS — R059 Cough, unspecified: Secondary | ICD-10-CM

## 2012-02-25 MED ORDER — AZITHROMYCIN 250 MG PO TABS: ORAL_TABLET | ORAL | Status: AC

## 2012-02-25 MED ORDER — HYDROCOD POLST-CHLORPHEN POLST 10-8 MG/5ML PO LQCR: 5.0000 mL | Freq: Two times a day (BID) | ORAL | Status: AC | PRN

## 2012-02-25 NOTE — Patient Instructions (Addendum)
Call in one week if cough no better

## 2012-02-25 NOTE — Progress Notes (Signed)
  Subjective:    Patient ID: Sergio Freeman, male    DOB: [birth year], 52 y.o.   MRN: [id number]  HPI  Nonsmoker seen with one month history of cough. Cough is mostly nonproductive. He's had some intermittent sore throat and fatigue. No appetite or weight changes. No GERD symptoms. No wheezing. No postnasal drip symptoms. No hemoptysis. No dyspnea. Has taken NyQuil without much relief. Denies facial pain or any colored nasal secretions other than rare yellowish nasal discharge. Cough is most bothersome at night.   Review of Systems  Constitutional: Negative for fever, chills and unexpected weight change.  HENT: Positive for voice change (mild laryngitis past couple of weeks) and postnasal drip (mild). Negative for congestion, sore throat and trouble swallowing.   Respiratory: Positive for cough. Negative for shortness of breath and wheezing.   Cardiovascular: Negative for chest pain and leg swelling.  Neurological: Negative for headaches.       Objective:   Physical Exam  Constitutional: He appears well-developed and well-nourished.  HENT:  Right Ear: External ear normal.  Left Ear: External ear normal.  Mouth/Throat: Oropharynx is clear and moist.  Neck: Neck supple.  Cardiovascular: Normal rate and regular rhythm.   Pulmonary/Chest: Effort normal and breath sounds normal. No respiratory distress. He has no wheezes. He has no rales.  Lymphadenopathy:    He has no cervical adenopathy.          Assessment & Plan:  Persistent cough. No evidence for reactive airway disease. Doubt acute/chronic sinusitis. No obvious GERD symptoms. Suspect post viral bronchitis cough. No ACE inhibitor use. No worrisome symptoms such as weight loss, hemoptysis, or dyspnea. Trial of cough suppressant with Tussionex 1 teaspoon each bedtime. Doubt atypical infection but if not improving by next week consider Zithromax. Touch base one week if not resolving

## 2015-07-10 DIAGNOSIS — L570 Actinic keratosis: Secondary | ICD-10-CM | POA: Diagnosis not present

## 2015-07-10 DIAGNOSIS — D225 Melanocytic nevi of trunk: Secondary | ICD-10-CM | POA: Diagnosis not present

## 2015-07-10 DIAGNOSIS — D1801 Hemangioma of skin and subcutaneous tissue: Secondary | ICD-10-CM | POA: Diagnosis not present

## 2015-07-10 DIAGNOSIS — D485 Neoplasm of uncertain behavior of skin: Secondary | ICD-10-CM | POA: Diagnosis not present

## 2015-08-21 DIAGNOSIS — E785 Hyperlipidemia, unspecified: Secondary | ICD-10-CM | POA: Diagnosis not present

## 2015-08-21 DIAGNOSIS — Z0000 Encounter for general adult medical examination without abnormal findings: Secondary | ICD-10-CM | POA: Diagnosis not present

## 2015-08-21 DIAGNOSIS — Z125 Encounter for screening for malignant neoplasm of prostate: Secondary | ICD-10-CM | POA: Diagnosis not present

## 2015-12-24 DIAGNOSIS — M791 Myalgia: Secondary | ICD-10-CM | POA: Diagnosis not present

## 2015-12-24 DIAGNOSIS — M542 Cervicalgia: Secondary | ICD-10-CM | POA: Diagnosis not present

## 2016-01-07 ENCOUNTER — Other Ambulatory Visit: Payer: Self-pay | Admitting: Neurology

## 2016-01-07 ENCOUNTER — Ambulatory Visit
Admission: RE | Admit: 2016-01-07 | Discharge: 2016-01-07 | Disposition: A | Discharge status: Home / Self Care | Payer: BLUE CROSS/BLUE SHIELD | Source: Ambulatory Visit | Attending: Neurology | Admitting: Neurology

## 2016-01-07 DIAGNOSIS — M542 Cervicalgia: Secondary | ICD-10-CM

## 2016-01-07 DIAGNOSIS — S199XXA Unspecified injury of neck, initial encounter: Secondary | ICD-10-CM | POA: Diagnosis not present

## 2016-01-08 ENCOUNTER — Other Ambulatory Visit: Payer: Self-pay | Admitting: Neurology

## 2016-01-08 DIAGNOSIS — I739 Peripheral vascular disease, unspecified: Principal | ICD-10-CM

## 2016-01-08 DIAGNOSIS — I779 Disorder of arteries and arterioles, unspecified: Secondary | ICD-10-CM

## 2016-01-15 ENCOUNTER — Ambulatory Visit
Admission: RE | Admit: 2016-01-15 | Discharge: 2016-01-15 | Disposition: A | Discharge status: Home / Self Care | Payer: BLUE CROSS/BLUE SHIELD | Source: Ambulatory Visit | Attending: Neurology | Admitting: Neurology

## 2016-01-15 DIAGNOSIS — I739 Peripheral vascular disease, unspecified: Principal | ICD-10-CM

## 2016-01-15 DIAGNOSIS — I6523 Occlusion and stenosis of bilateral carotid arteries: Secondary | ICD-10-CM | POA: Diagnosis not present

## 2016-01-15 DIAGNOSIS — I779 Disorder of arteries and arterioles, unspecified: Secondary | ICD-10-CM

## 2016-03-03 ENCOUNTER — Other Ambulatory Visit: Payer: Self-pay | Admitting: Internal Medicine

## 2016-03-03 DIAGNOSIS — Z8249 Family history of ischemic heart disease and other diseases of the circulatory system: Secondary | ICD-10-CM | POA: Diagnosis not present

## 2016-03-03 DIAGNOSIS — R030 Elevated blood-pressure reading, without diagnosis of hypertension: Secondary | ICD-10-CM | POA: Diagnosis not present

## 2016-03-03 DIAGNOSIS — E7800 Pure hypercholesterolemia, unspecified: Secondary | ICD-10-CM | POA: Diagnosis not present

## 2016-03-04 DIAGNOSIS — Z8249 Family history of ischemic heart disease and other diseases of the circulatory system: Secondary | ICD-10-CM | POA: Diagnosis not present

## 2016-03-04 DIAGNOSIS — R030 Elevated blood-pressure reading, without diagnosis of hypertension: Secondary | ICD-10-CM | POA: Diagnosis not present

## 2016-03-04 DIAGNOSIS — E7800 Pure hypercholesterolemia, unspecified: Secondary | ICD-10-CM | POA: Diagnosis not present

## 2016-03-09 ENCOUNTER — Ambulatory Visit
Admission: RE | Admit: 2016-03-09 | Discharge: 2016-03-09 | Disposition: A | Discharge status: Home / Self Care | Payer: No Typology Code available for payment source | Source: Ambulatory Visit | Attending: Internal Medicine | Admitting: Internal Medicine

## 2016-03-09 DIAGNOSIS — Z8249 Family history of ischemic heart disease and other diseases of the circulatory system: Secondary | ICD-10-CM

## 2016-04-28 DIAGNOSIS — M79604 Pain in right leg: Secondary | ICD-10-CM | POA: Diagnosis not present

## 2016-05-10 ENCOUNTER — Other Ambulatory Visit: Payer: Self-pay | Admitting: Internal Medicine

## 2016-05-10 DIAGNOSIS — M545 Low back pain: Secondary | ICD-10-CM

## 2016-05-12 ENCOUNTER — Other Ambulatory Visit (HOSPITAL_COMMUNITY): Payer: Self-pay | Admitting: Internal Medicine

## 2016-05-12 DIAGNOSIS — M545 Low back pain: Secondary | ICD-10-CM

## 2016-05-13 ENCOUNTER — Ambulatory Visit (HOSPITAL_COMMUNITY)
Admission: RE | Admit: 2016-05-13 | Discharge: 2016-05-13 | Disposition: A | Discharge status: Home / Self Care | Payer: BLUE CROSS/BLUE SHIELD | Source: Ambulatory Visit | Attending: Internal Medicine | Admitting: Internal Medicine

## 2016-05-13 ENCOUNTER — Encounter (HOSPITAL_COMMUNITY): Payer: Self-pay

## 2016-05-13 ENCOUNTER — Other Ambulatory Visit (HOSPITAL_COMMUNITY): Payer: Self-pay | Admitting: Internal Medicine

## 2016-05-13 DIAGNOSIS — M5126 Other intervertebral disc displacement, lumbar region: Secondary | ICD-10-CM | POA: Insufficient documentation

## 2016-05-13 DIAGNOSIS — M545 Low back pain: Secondary | ICD-10-CM

## 2016-05-18 ENCOUNTER — Other Ambulatory Visit: Payer: Self-pay

## 2016-05-27 DIAGNOSIS — M5416 Radiculopathy, lumbar region: Secondary | ICD-10-CM | POA: Diagnosis not present

## 2016-05-31 ENCOUNTER — Other Ambulatory Visit: Payer: Self-pay | Admitting: Neurosurgery

## 2016-05-31 DIAGNOSIS — M5416 Radiculopathy, lumbar region: Secondary | ICD-10-CM

## 2016-06-01 ENCOUNTER — Ambulatory Visit
Admission: RE | Admit: 2016-06-01 | Discharge: 2016-06-01 | Disposition: A | Discharge status: Home / Self Care | Payer: BLUE CROSS/BLUE SHIELD | Source: Ambulatory Visit | Attending: Neurosurgery | Admitting: Neurosurgery

## 2016-06-01 DIAGNOSIS — M5416 Radiculopathy, lumbar region: Secondary | ICD-10-CM

## 2016-06-01 DIAGNOSIS — M5137 Other intervertebral disc degeneration, lumbosacral region: Secondary | ICD-10-CM | POA: Diagnosis not present

## 2016-06-01 MED ORDER — IOPAMIDOL (ISOVUE-M 200) INJECTION 41%
1.0000 mL | Freq: Once | INTRAMUSCULAR | Status: AC
  Administered 2016-06-01: 1 mL via EPIDURAL

## 2016-06-01 MED ORDER — METHYLPREDNISOLONE ACETATE 40 MG/ML INJ SUSP (RADIOLOG
120.0000 mg | Freq: Once | INTRAMUSCULAR | Status: AC
  Administered 2016-06-01: 120 mg via EPIDURAL

## 2016-06-01 NOTE — Discharge Instructions (Signed)

## 2016-06-07 ENCOUNTER — Other Ambulatory Visit: Payer: Self-pay | Admitting: Neurosurgery

## 2016-06-07 DIAGNOSIS — M5416 Radiculopathy, lumbar region: Secondary | ICD-10-CM

## 2016-06-12 DIAGNOSIS — L237 Allergic contact dermatitis due to plants, except food: Secondary | ICD-10-CM | POA: Diagnosis not present

## 2016-06-15 ENCOUNTER — Ambulatory Visit
Admission: RE | Admit: 2016-06-15 | Discharge: 2016-06-15 | Disposition: A | Discharge status: Home / Self Care | Payer: BLUE CROSS/BLUE SHIELD | Source: Ambulatory Visit | Attending: Neurosurgery | Admitting: Neurosurgery

## 2016-06-15 DIAGNOSIS — M5416 Radiculopathy, lumbar region: Secondary | ICD-10-CM

## 2016-06-15 DIAGNOSIS — M5126 Other intervertebral disc displacement, lumbar region: Secondary | ICD-10-CM | POA: Diagnosis not present

## 2016-06-15 MED ORDER — IOPAMIDOL (ISOVUE-M 200) INJECTION 41%
1.0000 mL | Freq: Once | INTRAMUSCULAR | Status: AC
  Administered 2016-06-15: 1 mL via EPIDURAL

## 2016-06-15 MED ORDER — METHYLPREDNISOLONE ACETATE 40 MG/ML INJ SUSP (RADIOLOG
120.0000 mg | Freq: Once | INTRAMUSCULAR | Status: AC
  Administered 2016-06-15: 120 mg via EPIDURAL

## 2016-08-06 DIAGNOSIS — M5416 Radiculopathy, lumbar region: Secondary | ICD-10-CM | POA: Diagnosis not present

## 2016-08-17 DIAGNOSIS — M5136 Other intervertebral disc degeneration, lumbar region: Secondary | ICD-10-CM | POA: Diagnosis not present

## 2016-08-17 DIAGNOSIS — M5416 Radiculopathy, lumbar region: Secondary | ICD-10-CM | POA: Diagnosis not present

## 2016-08-17 DIAGNOSIS — M9903 Segmental and somatic dysfunction of lumbar region: Secondary | ICD-10-CM | POA: Diagnosis not present

## 2016-08-17 DIAGNOSIS — M9904 Segmental and somatic dysfunction of sacral region: Secondary | ICD-10-CM | POA: Diagnosis not present

## 2016-08-18 DIAGNOSIS — M9904 Segmental and somatic dysfunction of sacral region: Secondary | ICD-10-CM | POA: Diagnosis not present

## 2016-08-18 DIAGNOSIS — M9903 Segmental and somatic dysfunction of lumbar region: Secondary | ICD-10-CM | POA: Diagnosis not present

## 2016-08-18 DIAGNOSIS — Z0000 Encounter for general adult medical examination without abnormal findings: Secondary | ICD-10-CM | POA: Diagnosis not present

## 2016-08-18 DIAGNOSIS — M5416 Radiculopathy, lumbar region: Secondary | ICD-10-CM | POA: Diagnosis not present

## 2016-08-18 DIAGNOSIS — M5136 Other intervertebral disc degeneration, lumbar region: Secondary | ICD-10-CM | POA: Diagnosis not present

## 2016-08-19 DIAGNOSIS — M9904 Segmental and somatic dysfunction of sacral region: Secondary | ICD-10-CM | POA: Diagnosis not present

## 2016-08-19 DIAGNOSIS — M5136 Other intervertebral disc degeneration, lumbar region: Secondary | ICD-10-CM | POA: Diagnosis not present

## 2016-08-19 DIAGNOSIS — M9903 Segmental and somatic dysfunction of lumbar region: Secondary | ICD-10-CM | POA: Diagnosis not present

## 2016-08-19 DIAGNOSIS — M5416 Radiculopathy, lumbar region: Secondary | ICD-10-CM | POA: Diagnosis not present

## 2016-08-25 DIAGNOSIS — M5416 Radiculopathy, lumbar region: Secondary | ICD-10-CM | POA: Diagnosis not present

## 2016-08-25 DIAGNOSIS — M5136 Other intervertebral disc degeneration, lumbar region: Secondary | ICD-10-CM | POA: Diagnosis not present

## 2016-08-25 DIAGNOSIS — M9904 Segmental and somatic dysfunction of sacral region: Secondary | ICD-10-CM | POA: Diagnosis not present

## 2016-08-25 DIAGNOSIS — M9903 Segmental and somatic dysfunction of lumbar region: Secondary | ICD-10-CM | POA: Diagnosis not present

## 2016-08-26 DIAGNOSIS — M545 Low back pain: Secondary | ICD-10-CM | POA: Diagnosis not present

## 2016-08-26 DIAGNOSIS — M5126 Other intervertebral disc displacement, lumbar region: Secondary | ICD-10-CM | POA: Diagnosis not present

## 2016-08-26 DIAGNOSIS — Z96642 Presence of left artificial hip joint: Secondary | ICD-10-CM | POA: Diagnosis not present

## 2016-09-01 DIAGNOSIS — M5416 Radiculopathy, lumbar region: Secondary | ICD-10-CM | POA: Diagnosis not present

## 2016-09-01 DIAGNOSIS — M48061 Spinal stenosis, lumbar region without neurogenic claudication: Secondary | ICD-10-CM | POA: Diagnosis not present

## 2016-10-06 DIAGNOSIS — L821 Other seborrheic keratosis: Secondary | ICD-10-CM | POA: Diagnosis not present

## 2016-10-06 DIAGNOSIS — L570 Actinic keratosis: Secondary | ICD-10-CM | POA: Diagnosis not present

## 2016-10-06 DIAGNOSIS — D1801 Hemangioma of skin and subcutaneous tissue: Secondary | ICD-10-CM | POA: Diagnosis not present

## 2016-10-06 DIAGNOSIS — L814 Other melanin hyperpigmentation: Secondary | ICD-10-CM | POA: Diagnosis not present

## 2016-10-26 DIAGNOSIS — M545 Low back pain: Secondary | ICD-10-CM | POA: Diagnosis not present

## 2016-10-26 DIAGNOSIS — M79606 Pain in leg, unspecified: Secondary | ICD-10-CM | POA: Diagnosis not present

## 2016-10-29 DIAGNOSIS — M79606 Pain in leg, unspecified: Secondary | ICD-10-CM | POA: Diagnosis not present

## 2016-10-29 DIAGNOSIS — M545 Low back pain: Secondary | ICD-10-CM | POA: Diagnosis not present

## 2016-11-09 DIAGNOSIS — M79606 Pain in leg, unspecified: Secondary | ICD-10-CM | POA: Diagnosis not present

## 2016-11-09 DIAGNOSIS — M545 Low back pain: Secondary | ICD-10-CM | POA: Diagnosis not present

## 2016-12-22 DIAGNOSIS — Z23 Encounter for immunization: Secondary | ICD-10-CM | POA: Diagnosis not present

## 2016-12-22 DIAGNOSIS — M779 Enthesopathy, unspecified: Secondary | ICD-10-CM | POA: Diagnosis not present

## 2017-07-27 DIAGNOSIS — L237 Allergic contact dermatitis due to plants, except food: Secondary | ICD-10-CM | POA: Diagnosis not present

## 2017-09-23 DIAGNOSIS — E7800 Pure hypercholesterolemia, unspecified: Secondary | ICD-10-CM | POA: Diagnosis not present

## 2017-09-23 DIAGNOSIS — R030 Elevated blood-pressure reading, without diagnosis of hypertension: Secondary | ICD-10-CM | POA: Diagnosis not present

## 2017-09-23 DIAGNOSIS — Z125 Encounter for screening for malignant neoplasm of prostate: Secondary | ICD-10-CM | POA: Diagnosis not present

## 2017-09-23 DIAGNOSIS — Z0000 Encounter for general adult medical examination without abnormal findings: Secondary | ICD-10-CM | POA: Diagnosis not present

## 2017-10-12 DIAGNOSIS — L570 Actinic keratosis: Secondary | ICD-10-CM | POA: Diagnosis not present

## 2017-10-12 DIAGNOSIS — L718 Other rosacea: Secondary | ICD-10-CM | POA: Diagnosis not present

## 2017-10-12 DIAGNOSIS — D485 Neoplasm of uncertain behavior of skin: Secondary | ICD-10-CM | POA: Diagnosis not present

## 2017-10-12 DIAGNOSIS — L814 Other melanin hyperpigmentation: Secondary | ICD-10-CM | POA: Diagnosis not present

## 2018-01-10 DIAGNOSIS — H1045 Other chronic allergic conjunctivitis: Secondary | ICD-10-CM | POA: Diagnosis not present

## 2018-01-28 DIAGNOSIS — F4320 Adjustment disorder, unspecified: Secondary | ICD-10-CM | POA: Diagnosis not present

## 2018-02-10 DIAGNOSIS — F4320 Adjustment disorder, unspecified: Secondary | ICD-10-CM | POA: Diagnosis not present

## 2018-03-08 DIAGNOSIS — H2511 Age-related nuclear cataract, right eye: Secondary | ICD-10-CM | POA: Diagnosis not present

## 2018-03-08 DIAGNOSIS — H2513 Age-related nuclear cataract, bilateral: Secondary | ICD-10-CM | POA: Diagnosis not present

## 2018-03-08 DIAGNOSIS — H25043 Posterior subcapsular polar age-related cataract, bilateral: Secondary | ICD-10-CM | POA: Diagnosis not present

## 2018-03-08 DIAGNOSIS — H11042 Peripheral pterygium, stationary, left eye: Secondary | ICD-10-CM | POA: Diagnosis not present

## 2018-03-17 ENCOUNTER — Encounter (INDEPENDENT_AMBULATORY_CARE_PROVIDER_SITE_OTHER): Payer: BLUE CROSS/BLUE SHIELD | Admitting: Ophthalmology

## 2018-03-17 DIAGNOSIS — H5213 Myopia, bilateral: Secondary | ICD-10-CM

## 2018-03-17 DIAGNOSIS — H2513 Age-related nuclear cataract, bilateral: Secondary | ICD-10-CM | POA: Diagnosis not present

## 2018-03-17 DIAGNOSIS — H43813 Vitreous degeneration, bilateral: Secondary | ICD-10-CM | POA: Diagnosis not present

## 2018-04-07 ENCOUNTER — Other Ambulatory Visit: Payer: Self-pay | Admitting: Orthopedic Surgery

## 2018-04-07 ENCOUNTER — Ambulatory Visit
Admission: RE | Admit: 2018-04-07 | Discharge: 2018-04-07 | Disposition: A | Discharge status: Home / Self Care | Payer: BLUE CROSS/BLUE SHIELD | Source: Ambulatory Visit | Attending: Orthopedic Surgery | Admitting: Orthopedic Surgery

## 2018-04-07 DIAGNOSIS — G8929 Other chronic pain: Secondary | ICD-10-CM

## 2018-04-07 DIAGNOSIS — M25559 Pain in unspecified hip: Principal | ICD-10-CM

## 2018-04-07 DIAGNOSIS — Z471 Aftercare following joint replacement surgery: Secondary | ICD-10-CM | POA: Diagnosis not present

## 2018-04-07 DIAGNOSIS — Z96642 Presence of left artificial hip joint: Secondary | ICD-10-CM | POA: Diagnosis not present

## 2018-04-12 DIAGNOSIS — D485 Neoplasm of uncertain behavior of skin: Secondary | ICD-10-CM | POA: Diagnosis not present

## 2018-04-12 DIAGNOSIS — L820 Inflamed seborrheic keratosis: Secondary | ICD-10-CM | POA: Diagnosis not present

## 2018-07-21 DIAGNOSIS — Z20828 Contact with and (suspected) exposure to other viral communicable diseases: Secondary | ICD-10-CM | POA: Diagnosis not present

## 2018-07-21 DIAGNOSIS — U071 COVID-19: Secondary | ICD-10-CM | POA: Diagnosis not present

## 2018-09-27 DIAGNOSIS — I10 Essential (primary) hypertension: Secondary | ICD-10-CM | POA: Diagnosis not present

## 2018-09-27 DIAGNOSIS — E7800 Pure hypercholesterolemia, unspecified: Secondary | ICD-10-CM | POA: Diagnosis not present

## 2018-09-27 DIAGNOSIS — Z0000 Encounter for general adult medical examination without abnormal findings: Secondary | ICD-10-CM | POA: Diagnosis not present

## 2018-09-27 DIAGNOSIS — Z1159 Encounter for screening for other viral diseases: Secondary | ICD-10-CM | POA: Diagnosis not present

## 2018-09-27 DIAGNOSIS — M5431 Sciatica, right side: Secondary | ICD-10-CM | POA: Diagnosis not present

## 2018-10-17 DIAGNOSIS — M79606 Pain in leg, unspecified: Secondary | ICD-10-CM | POA: Diagnosis not present

## 2018-10-17 DIAGNOSIS — M545 Low back pain: Secondary | ICD-10-CM | POA: Diagnosis not present

## 2018-10-25 DIAGNOSIS — L821 Other seborrheic keratosis: Secondary | ICD-10-CM | POA: Diagnosis not present

## 2018-10-25 DIAGNOSIS — L814 Other melanin hyperpigmentation: Secondary | ICD-10-CM | POA: Diagnosis not present

## 2018-10-25 DIAGNOSIS — M545 Low back pain: Secondary | ICD-10-CM | POA: Diagnosis not present

## 2018-10-25 DIAGNOSIS — L723 Sebaceous cyst: Secondary | ICD-10-CM | POA: Diagnosis not present

## 2018-10-25 DIAGNOSIS — M79606 Pain in leg, unspecified: Secondary | ICD-10-CM | POA: Diagnosis not present

## 2018-10-25 DIAGNOSIS — D1801 Hemangioma of skin and subcutaneous tissue: Secondary | ICD-10-CM | POA: Diagnosis not present

## 2018-10-31 DIAGNOSIS — M545 Low back pain: Secondary | ICD-10-CM | POA: Diagnosis not present

## 2018-10-31 DIAGNOSIS — M79606 Pain in leg, unspecified: Secondary | ICD-10-CM | POA: Diagnosis not present

## 2018-11-06 DIAGNOSIS — Z5181 Encounter for therapeutic drug level monitoring: Secondary | ICD-10-CM | POA: Diagnosis not present

## 2018-11-06 DIAGNOSIS — I10 Essential (primary) hypertension: Secondary | ICD-10-CM | POA: Diagnosis not present

## 2018-11-06 DIAGNOSIS — E7800 Pure hypercholesterolemia, unspecified: Secondary | ICD-10-CM | POA: Diagnosis not present

## 2018-11-09 DIAGNOSIS — Z01818 Encounter for other preprocedural examination: Secondary | ICD-10-CM | POA: Diagnosis not present

## 2018-11-11 DIAGNOSIS — M545 Low back pain: Secondary | ICD-10-CM | POA: Diagnosis not present

## 2018-11-11 DIAGNOSIS — M79606 Pain in leg, unspecified: Secondary | ICD-10-CM | POA: Diagnosis not present

## 2018-11-14 DIAGNOSIS — E7800 Pure hypercholesterolemia, unspecified: Secondary | ICD-10-CM | POA: Diagnosis not present

## 2018-11-14 DIAGNOSIS — Z5181 Encounter for therapeutic drug level monitoring: Secondary | ICD-10-CM | POA: Diagnosis not present

## 2018-12-06 ENCOUNTER — Other Ambulatory Visit: Payer: Self-pay | Admitting: Internal Medicine

## 2018-12-06 DIAGNOSIS — M79604 Pain in right leg: Secondary | ICD-10-CM

## 2018-12-26 ENCOUNTER — Ambulatory Visit
Admission: RE | Admit: 2018-12-26 | Discharge: 2018-12-26 | Disposition: A | Discharge status: Home / Self Care | Payer: BLUE CROSS/BLUE SHIELD | Source: Ambulatory Visit | Attending: Internal Medicine | Admitting: Internal Medicine

## 2018-12-26 ENCOUNTER — Other Ambulatory Visit: Payer: Self-pay

## 2018-12-26 DIAGNOSIS — M79604 Pain in right leg: Secondary | ICD-10-CM

## 2018-12-26 DIAGNOSIS — M48061 Spinal stenosis, lumbar region without neurogenic claudication: Secondary | ICD-10-CM | POA: Diagnosis not present

## 2019-01-09 DIAGNOSIS — Z1159 Encounter for screening for other viral diseases: Secondary | ICD-10-CM | POA: Diagnosis not present

## 2019-01-12 DIAGNOSIS — Z1211 Encounter for screening for malignant neoplasm of colon: Secondary | ICD-10-CM | POA: Diagnosis not present

## 2019-01-12 DIAGNOSIS — D122 Benign neoplasm of ascending colon: Secondary | ICD-10-CM | POA: Diagnosis not present

## 2019-01-12 DIAGNOSIS — K5730 Diverticulosis of large intestine without perforation or abscess without bleeding: Secondary | ICD-10-CM | POA: Diagnosis not present

## 2019-02-13 DIAGNOSIS — Z96642 Presence of left artificial hip joint: Secondary | ICD-10-CM | POA: Diagnosis not present

## 2019-02-13 DIAGNOSIS — M79604 Pain in right leg: Secondary | ICD-10-CM | POA: Diagnosis not present

## 2019-02-13 DIAGNOSIS — M5136 Other intervertebral disc degeneration, lumbar region: Secondary | ICD-10-CM | POA: Diagnosis not present

## 2019-02-13 DIAGNOSIS — M48061 Spinal stenosis, lumbar region without neurogenic claudication: Secondary | ICD-10-CM | POA: Diagnosis not present

## 2019-02-13 DIAGNOSIS — Z9889 Other specified postprocedural states: Secondary | ICD-10-CM | POA: Diagnosis not present

## 2019-04-17 DIAGNOSIS — M48061 Spinal stenosis, lumbar region without neurogenic claudication: Secondary | ICD-10-CM | POA: Diagnosis not present

## 2019-04-17 DIAGNOSIS — M5417 Radiculopathy, lumbosacral region: Secondary | ICD-10-CM | POA: Diagnosis not present

## 2019-04-18 DIAGNOSIS — M5416 Radiculopathy, lumbar region: Secondary | ICD-10-CM | POA: Diagnosis not present

## 2019-04-24 DIAGNOSIS — M9903 Segmental and somatic dysfunction of lumbar region: Secondary | ICD-10-CM | POA: Diagnosis not present

## 2019-04-26 DIAGNOSIS — E7800 Pure hypercholesterolemia, unspecified: Secondary | ICD-10-CM | POA: Diagnosis not present

## 2019-04-26 DIAGNOSIS — I10 Essential (primary) hypertension: Secondary | ICD-10-CM | POA: Diagnosis not present

## 2019-04-26 DIAGNOSIS — M5431 Sciatica, right side: Secondary | ICD-10-CM | POA: Diagnosis not present

## 2019-04-30 DIAGNOSIS — M9903 Segmental and somatic dysfunction of lumbar region: Secondary | ICD-10-CM | POA: Diagnosis not present

## 2019-05-02 DIAGNOSIS — M9903 Segmental and somatic dysfunction of lumbar region: Secondary | ICD-10-CM | POA: Diagnosis not present

## 2019-05-07 DIAGNOSIS — M9903 Segmental and somatic dysfunction of lumbar region: Secondary | ICD-10-CM | POA: Diagnosis not present

## 2019-05-10 DIAGNOSIS — M5431 Sciatica, right side: Secondary | ICD-10-CM | POA: Diagnosis not present

## 2019-05-10 DIAGNOSIS — M9903 Segmental and somatic dysfunction of lumbar region: Secondary | ICD-10-CM | POA: Diagnosis not present

## 2019-05-15 DIAGNOSIS — M9903 Segmental and somatic dysfunction of lumbar region: Secondary | ICD-10-CM | POA: Diagnosis not present

## 2019-05-16 DIAGNOSIS — M9903 Segmental and somatic dysfunction of lumbar region: Secondary | ICD-10-CM | POA: Diagnosis not present

## 2019-05-23 DIAGNOSIS — M9903 Segmental and somatic dysfunction of lumbar region: Secondary | ICD-10-CM | POA: Diagnosis not present

## 2019-05-29 DIAGNOSIS — M9903 Segmental and somatic dysfunction of lumbar region: Secondary | ICD-10-CM | POA: Diagnosis not present

## 2019-05-31 DIAGNOSIS — M9903 Segmental and somatic dysfunction of lumbar region: Secondary | ICD-10-CM | POA: Diagnosis not present

## 2019-06-04 DIAGNOSIS — M9903 Segmental and somatic dysfunction of lumbar region: Secondary | ICD-10-CM | POA: Diagnosis not present

## 2019-06-07 DIAGNOSIS — M9903 Segmental and somatic dysfunction of lumbar region: Secondary | ICD-10-CM | POA: Diagnosis not present

## 2019-06-12 DIAGNOSIS — M9903 Segmental and somatic dysfunction of lumbar region: Secondary | ICD-10-CM | POA: Diagnosis not present

## 2019-06-26 DIAGNOSIS — M9903 Segmental and somatic dysfunction of lumbar region: Secondary | ICD-10-CM | POA: Diagnosis not present

## 2019-07-31 DIAGNOSIS — M5126 Other intervertebral disc displacement, lumbar region: Secondary | ICD-10-CM | POA: Diagnosis not present

## 2019-07-31 DIAGNOSIS — M5416 Radiculopathy, lumbar region: Secondary | ICD-10-CM | POA: Diagnosis not present

## 2019-08-09 DIAGNOSIS — M48061 Spinal stenosis, lumbar region without neurogenic claudication: Secondary | ICD-10-CM | POA: Diagnosis not present

## 2019-10-30 DIAGNOSIS — L814 Other melanin hyperpigmentation: Secondary | ICD-10-CM | POA: Diagnosis not present

## 2019-10-30 DIAGNOSIS — L570 Actinic keratosis: Secondary | ICD-10-CM | POA: Diagnosis not present

## 2019-10-30 DIAGNOSIS — D485 Neoplasm of uncertain behavior of skin: Secondary | ICD-10-CM | POA: Diagnosis not present

## 2019-10-30 DIAGNOSIS — L821 Other seborrheic keratosis: Secondary | ICD-10-CM | POA: Diagnosis not present

## 2019-11-13 DIAGNOSIS — G8929 Other chronic pain: Secondary | ICD-10-CM | POA: Diagnosis not present

## 2019-11-13 DIAGNOSIS — M545 Low back pain, unspecified: Secondary | ICD-10-CM | POA: Diagnosis not present

## 2019-11-19 DIAGNOSIS — Z125 Encounter for screening for malignant neoplasm of prostate: Secondary | ICD-10-CM | POA: Diagnosis not present

## 2019-11-19 DIAGNOSIS — I10 Essential (primary) hypertension: Secondary | ICD-10-CM | POA: Diagnosis not present

## 2019-11-19 DIAGNOSIS — M5431 Sciatica, right side: Secondary | ICD-10-CM | POA: Diagnosis not present

## 2019-11-19 DIAGNOSIS — Z0000 Encounter for general adult medical examination without abnormal findings: Secondary | ICD-10-CM | POA: Diagnosis not present

## 2019-11-19 DIAGNOSIS — E7800 Pure hypercholesterolemia, unspecified: Secondary | ICD-10-CM | POA: Diagnosis not present

## 2019-11-26 ENCOUNTER — Other Ambulatory Visit: Payer: Self-pay | Admitting: Neurosurgery

## 2019-11-26 DIAGNOSIS — G8929 Other chronic pain: Secondary | ICD-10-CM

## 2019-11-26 DIAGNOSIS — M545 Low back pain, unspecified: Secondary | ICD-10-CM

## 2019-11-26 DIAGNOSIS — K6289 Other specified diseases of anus and rectum: Secondary | ICD-10-CM

## 2019-12-22 IMAGING — MR MR LUMBAR SPINE W/O CM
4 of 5 series · 25 of 48 positions shown · non-contrast
Comparison: 05/13/2016

CLINICAL DATA: Low back and right leg pain for 7 months.

EXAM:
MRI LUMBAR SPINE WITHOUT CONTRAST
TECHNIQUE: Multiplanar, multisequence MR imaging of the lumbar spine was
performed. No intravenous contrast was administered.

[Series 3: T1 · sagittal · 4.0mm · 0.55mm/px · 5 of 13 slices shown (1 of 2)]
[im 1/13]
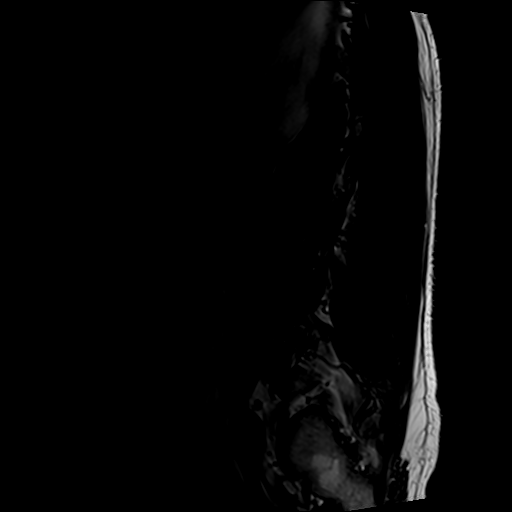
[im 4/13]
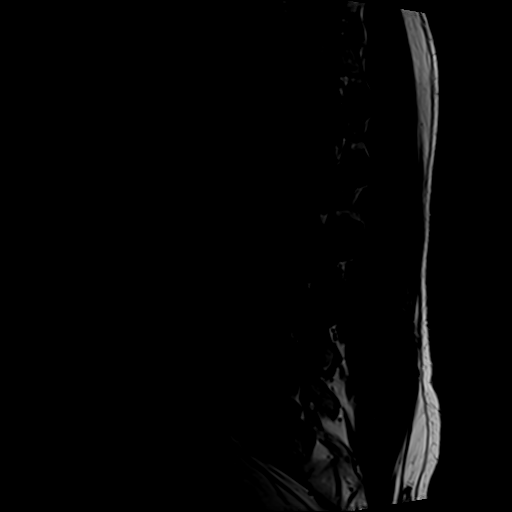
[im 7/13]
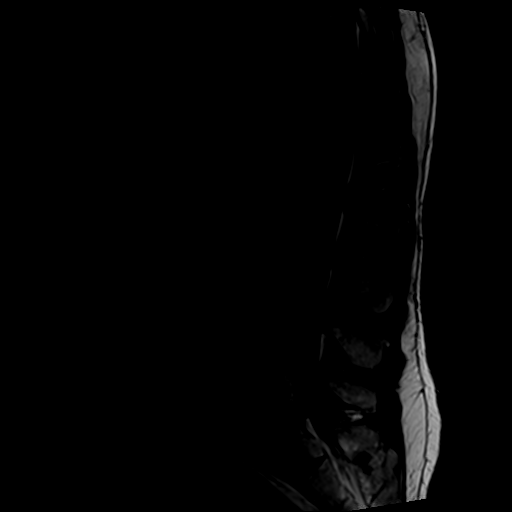
[im 10/13]
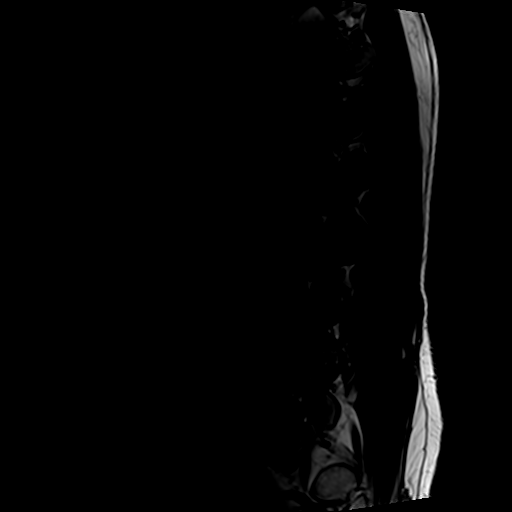
[im 13/13]
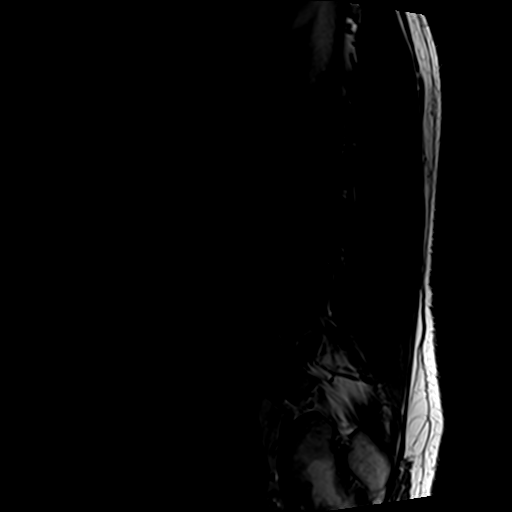

[Series 4: T2 post-contrast · sagittal · 4.0mm · 0.55mm/px · 5 of 13 slices shown]
[im 1/13]
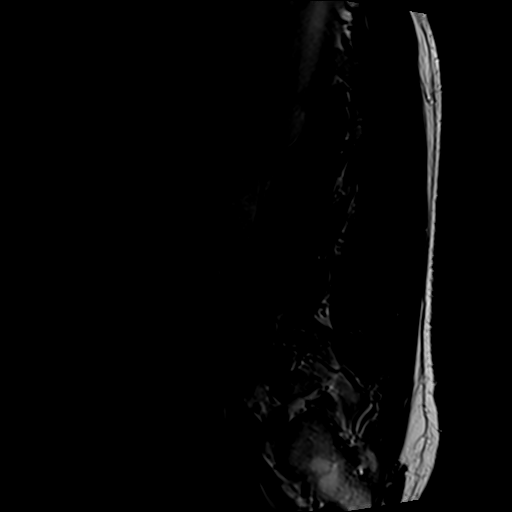
[im 4/13]
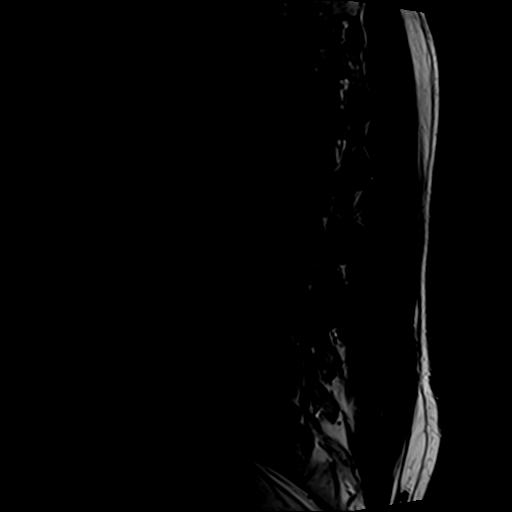
[im 7/13]
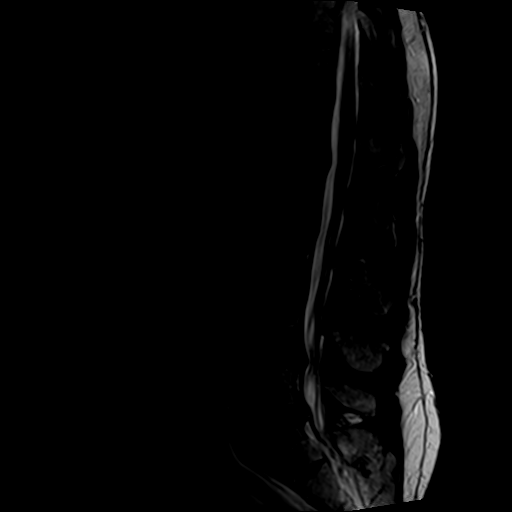
[im 10/13]
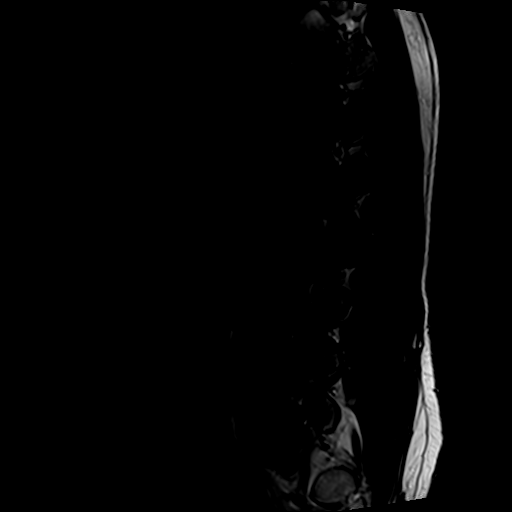
[im 13/13]
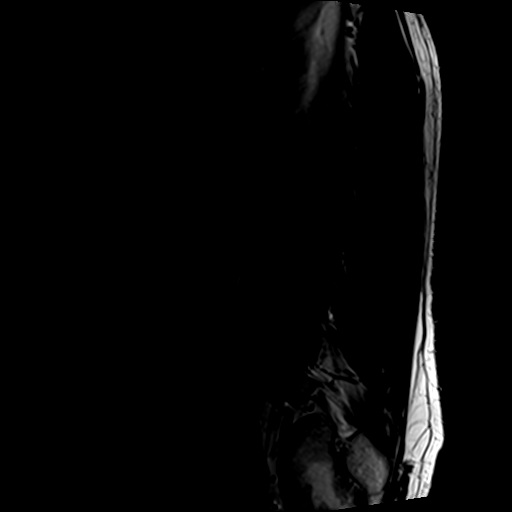

[Series 5: T2 · axial · 4.0mm · 0.70mm/px · z∈[-95,+112]mm · 10 of 40 slices shown]
[im 3/40]
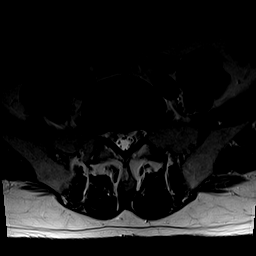
[im 6/40]
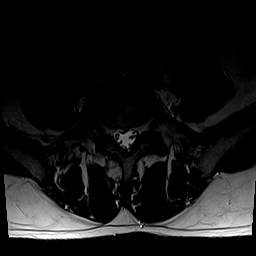
[im 8/40]
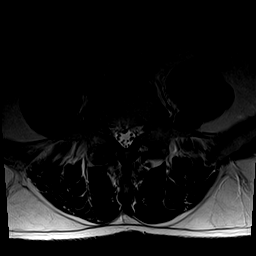
[im 14/40]
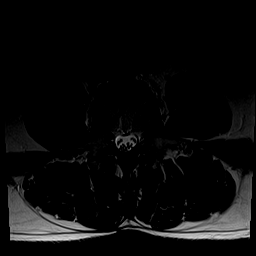
[im 19/40]
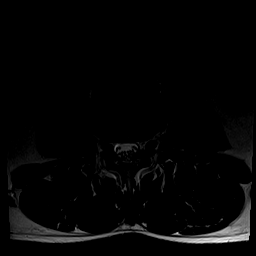
[im 21/40]
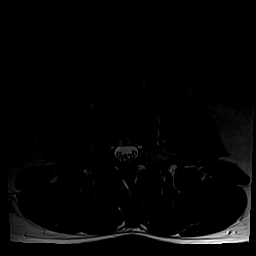
[im 24/40]
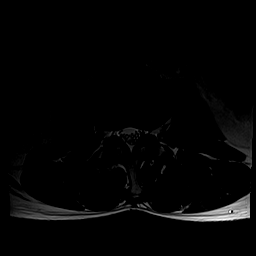
[im 29/40]
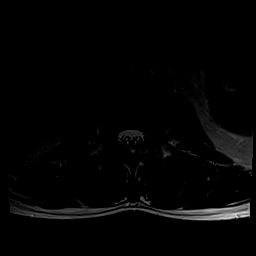
[im 34/40]
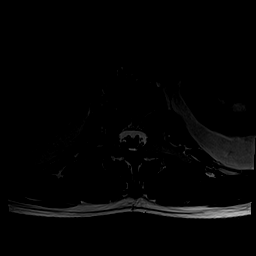
[im 40/40]
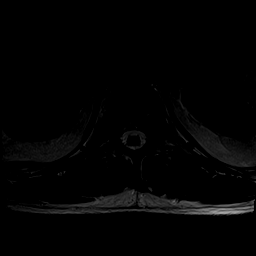

[Series 6: T1 · axial · 4.0mm · 0.35mm/px · z∈[-95,+81]mm · 5 of 40 slices shown (2 of 2)]
[im 3/40]
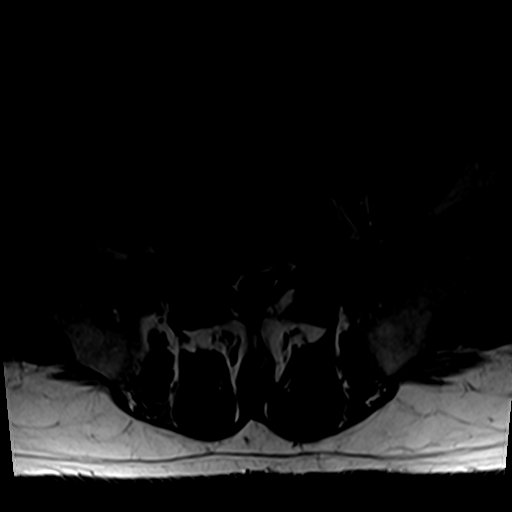
[im 6/40]
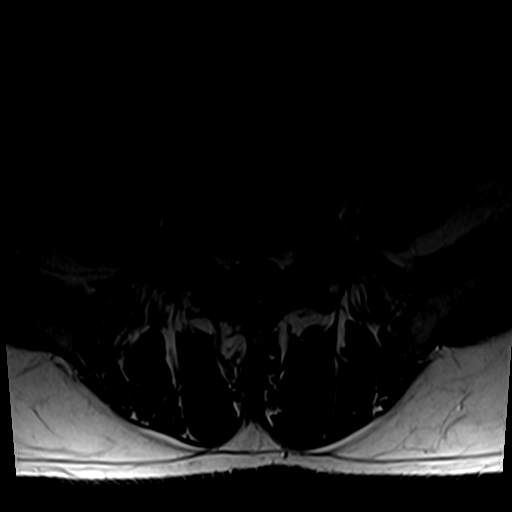
[im 8/40]
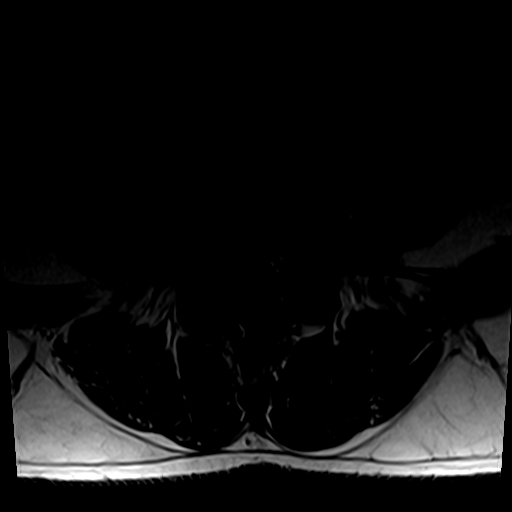
[im 21/40]
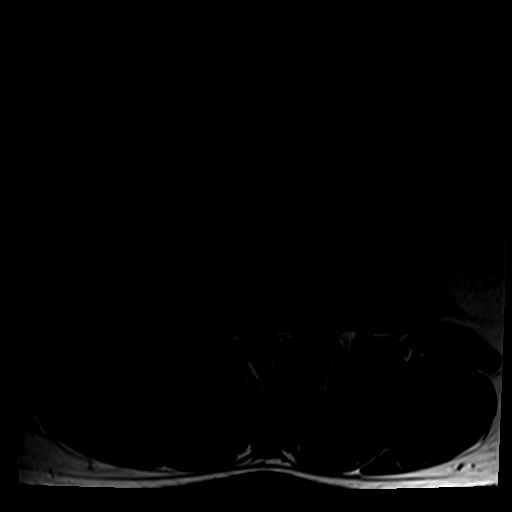
[im 34/40]
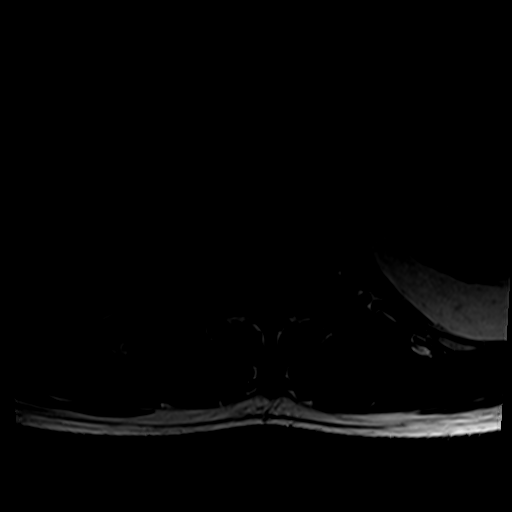

[25 of 48 positions shown; findings below may reference images not displayed]

FINDINGS: Segmentation:  Standard lumbar numbering

Alignment:  Normal

Vertebrae:  No fracture, evidence of discitis, or bone lesion.

Conus medullaris and cauda equina: Conus extends to the L2 level.
Conus and cauda equina appear normal.

Paraspinal and other soft tissues: Negative

Disc levels:

T12- L1: Unremarkable.

L1-L2: Mild disc narrowing and desiccation. Ventral spondylitic
spurring. No change or impingement

L2-L3: Mild disc narrowing and desiccation with mild disc bulging.
Small protrusion into the inferior left foramen without neural
impingement

L3-L4: Mild disc desiccation and narrowing with mild disc bulging.
Borderline facet spurring. No neural impingement

L4-L5: Disc narrowing and bulging with right paracentral to
foraminal protrusion. Posterior element hypertrophy which is
moderate. There is progressive and moderate spinal stenosis.
Progressive subarticular recess narrowing with impingement on the L5
nerve roots on the right more than left. A right foraminal to
far-lateral protrusion contacts the L4 nerve root, stable.

L5-S1:Disc narrowing and bulging with annular fissure and broad
right eccentric protrusion which contacts the right S1 nerve root.
IMPRESSION: 1. Disc and facet degeneration most notable at L4-5 and L5-S1.
2. L4-5 moderate spinal stenosis with right more than left L5
impingement in the subarticular recesses. The stenoses have
progressed from 2364. A right foraminal to far-lateral protrusion
chronically contacts the far lateral L4 nerve root.
3. L5-S1 broad disc protrusion which could affect the right S1 nerve
root.

## 2020-07-15 DIAGNOSIS — H5203 Hypermetropia, bilateral: Secondary | ICD-10-CM | POA: Diagnosis not present

## 2020-07-15 DIAGNOSIS — H35033 Hypertensive retinopathy, bilateral: Secondary | ICD-10-CM | POA: Diagnosis not present

## 2020-07-15 DIAGNOSIS — I10 Essential (primary) hypertension: Secondary | ICD-10-CM | POA: Diagnosis not present

## 2020-07-24 DIAGNOSIS — L438 Other lichen planus: Secondary | ICD-10-CM | POA: Diagnosis not present

## 2020-07-24 DIAGNOSIS — L820 Inflamed seborrheic keratosis: Secondary | ICD-10-CM | POA: Diagnosis not present

## 2020-07-24 DIAGNOSIS — D225 Melanocytic nevi of trunk: Secondary | ICD-10-CM | POA: Diagnosis not present

## 2020-07-24 DIAGNOSIS — L821 Other seborrheic keratosis: Secondary | ICD-10-CM | POA: Diagnosis not present

## 2020-07-24 DIAGNOSIS — D1801 Hemangioma of skin and subcutaneous tissue: Secondary | ICD-10-CM | POA: Diagnosis not present

## 2020-10-29 DIAGNOSIS — M7061 Trochanteric bursitis, right hip: Secondary | ICD-10-CM | POA: Diagnosis not present

## 2020-10-29 DIAGNOSIS — M7918 Myalgia, other site: Secondary | ICD-10-CM | POA: Diagnosis not present

## 2020-10-29 DIAGNOSIS — M9905 Segmental and somatic dysfunction of pelvic region: Secondary | ICD-10-CM | POA: Diagnosis not present

## 2020-11-05 DIAGNOSIS — M9905 Segmental and somatic dysfunction of pelvic region: Secondary | ICD-10-CM | POA: Diagnosis not present

## 2020-11-05 DIAGNOSIS — M7061 Trochanteric bursitis, right hip: Secondary | ICD-10-CM | POA: Diagnosis not present

## 2020-11-05 DIAGNOSIS — M7918 Myalgia, other site: Secondary | ICD-10-CM | POA: Diagnosis not present

## 2020-11-06 ENCOUNTER — Ambulatory Visit
Admission: RE | Admit: 2020-11-06 | Discharge: 2020-11-06 | Disposition: A | Discharge status: Home / Self Care | Payer: BC Managed Care – PPO | Source: Ambulatory Visit | Attending: Chiropractic Medicine | Admitting: Chiropractic Medicine

## 2020-11-06 ENCOUNTER — Other Ambulatory Visit: Payer: Self-pay | Admitting: Chiropractic Medicine

## 2020-11-06 ENCOUNTER — Other Ambulatory Visit: Payer: Self-pay

## 2020-11-06 DIAGNOSIS — G8929 Other chronic pain: Secondary | ICD-10-CM

## 2020-11-06 DIAGNOSIS — M25551 Pain in right hip: Secondary | ICD-10-CM | POA: Diagnosis not present

## 2020-11-10 DIAGNOSIS — M7918 Myalgia, other site: Secondary | ICD-10-CM | POA: Diagnosis not present

## 2020-11-10 DIAGNOSIS — M7061 Trochanteric bursitis, right hip: Secondary | ICD-10-CM | POA: Diagnosis not present

## 2020-11-10 DIAGNOSIS — M9905 Segmental and somatic dysfunction of pelvic region: Secondary | ICD-10-CM | POA: Diagnosis not present

## 2020-11-10 DIAGNOSIS — M1611 Unilateral primary osteoarthritis, right hip: Secondary | ICD-10-CM | POA: Diagnosis not present

## 2020-11-13 DIAGNOSIS — M9905 Segmental and somatic dysfunction of pelvic region: Secondary | ICD-10-CM | POA: Diagnosis not present

## 2020-11-13 DIAGNOSIS — M7061 Trochanteric bursitis, right hip: Secondary | ICD-10-CM | POA: Diagnosis not present

## 2020-11-13 DIAGNOSIS — M1611 Unilateral primary osteoarthritis, right hip: Secondary | ICD-10-CM | POA: Diagnosis not present

## 2020-11-13 DIAGNOSIS — M7918 Myalgia, other site: Secondary | ICD-10-CM | POA: Diagnosis not present

## 2020-11-17 DIAGNOSIS — M1611 Unilateral primary osteoarthritis, right hip: Secondary | ICD-10-CM | POA: Diagnosis not present

## 2020-11-17 DIAGNOSIS — M7061 Trochanteric bursitis, right hip: Secondary | ICD-10-CM | POA: Diagnosis not present

## 2020-11-17 DIAGNOSIS — M7918 Myalgia, other site: Secondary | ICD-10-CM | POA: Diagnosis not present

## 2020-11-17 DIAGNOSIS — M9905 Segmental and somatic dysfunction of pelvic region: Secondary | ICD-10-CM | POA: Diagnosis not present

## 2020-11-19 DIAGNOSIS — M1611 Unilateral primary osteoarthritis, right hip: Secondary | ICD-10-CM | POA: Diagnosis not present

## 2020-11-19 DIAGNOSIS — M7061 Trochanteric bursitis, right hip: Secondary | ICD-10-CM | POA: Diagnosis not present

## 2020-11-19 DIAGNOSIS — M9905 Segmental and somatic dysfunction of pelvic region: Secondary | ICD-10-CM | POA: Diagnosis not present

## 2020-11-19 DIAGNOSIS — M7918 Myalgia, other site: Secondary | ICD-10-CM | POA: Diagnosis not present

## 2020-11-20 DIAGNOSIS — M1611 Unilateral primary osteoarthritis, right hip: Secondary | ICD-10-CM | POA: Diagnosis not present

## 2020-11-20 DIAGNOSIS — Z0000 Encounter for general adult medical examination without abnormal findings: Secondary | ICD-10-CM | POA: Diagnosis not present

## 2020-11-20 DIAGNOSIS — E7800 Pure hypercholesterolemia, unspecified: Secondary | ICD-10-CM | POA: Diagnosis not present

## 2020-11-20 DIAGNOSIS — I10 Essential (primary) hypertension: Secondary | ICD-10-CM | POA: Diagnosis not present

## 2020-11-24 DIAGNOSIS — M1611 Unilateral primary osteoarthritis, right hip: Secondary | ICD-10-CM | POA: Diagnosis not present

## 2020-11-24 DIAGNOSIS — M7918 Myalgia, other site: Secondary | ICD-10-CM | POA: Diagnosis not present

## 2020-11-24 DIAGNOSIS — M7061 Trochanteric bursitis, right hip: Secondary | ICD-10-CM | POA: Diagnosis not present

## 2020-11-24 DIAGNOSIS — M9905 Segmental and somatic dysfunction of pelvic region: Secondary | ICD-10-CM | POA: Diagnosis not present

## 2020-11-27 DIAGNOSIS — M9905 Segmental and somatic dysfunction of pelvic region: Secondary | ICD-10-CM | POA: Diagnosis not present

## 2020-11-27 DIAGNOSIS — M7061 Trochanteric bursitis, right hip: Secondary | ICD-10-CM | POA: Diagnosis not present

## 2020-11-27 DIAGNOSIS — M1611 Unilateral primary osteoarthritis, right hip: Secondary | ICD-10-CM | POA: Diagnosis not present

## 2020-11-27 DIAGNOSIS — M7918 Myalgia, other site: Secondary | ICD-10-CM | POA: Diagnosis not present

## 2020-12-02 DIAGNOSIS — M9905 Segmental and somatic dysfunction of pelvic region: Secondary | ICD-10-CM | POA: Diagnosis not present

## 2020-12-02 DIAGNOSIS — M7918 Myalgia, other site: Secondary | ICD-10-CM | POA: Diagnosis not present

## 2020-12-02 DIAGNOSIS — M7061 Trochanteric bursitis, right hip: Secondary | ICD-10-CM | POA: Diagnosis not present

## 2020-12-02 DIAGNOSIS — M1611 Unilateral primary osteoarthritis, right hip: Secondary | ICD-10-CM | POA: Diagnosis not present

## 2020-12-04 DIAGNOSIS — M9905 Segmental and somatic dysfunction of pelvic region: Secondary | ICD-10-CM | POA: Diagnosis not present

## 2020-12-04 DIAGNOSIS — M7061 Trochanteric bursitis, right hip: Secondary | ICD-10-CM | POA: Diagnosis not present

## 2020-12-04 DIAGNOSIS — M1611 Unilateral primary osteoarthritis, right hip: Secondary | ICD-10-CM | POA: Diagnosis not present

## 2020-12-04 DIAGNOSIS — M7918 Myalgia, other site: Secondary | ICD-10-CM | POA: Diagnosis not present

## 2020-12-10 DIAGNOSIS — M9905 Segmental and somatic dysfunction of pelvic region: Secondary | ICD-10-CM | POA: Diagnosis not present

## 2020-12-10 DIAGNOSIS — M7061 Trochanteric bursitis, right hip: Secondary | ICD-10-CM | POA: Diagnosis not present

## 2020-12-10 DIAGNOSIS — M1611 Unilateral primary osteoarthritis, right hip: Secondary | ICD-10-CM | POA: Diagnosis not present

## 2020-12-10 DIAGNOSIS — M7918 Myalgia, other site: Secondary | ICD-10-CM | POA: Diagnosis not present

## 2021-07-29 DIAGNOSIS — L738 Other specified follicular disorders: Secondary | ICD-10-CM | POA: Diagnosis not present

## 2021-07-29 DIAGNOSIS — L821 Other seborrheic keratosis: Secondary | ICD-10-CM | POA: Diagnosis not present

## 2021-07-29 DIAGNOSIS — L570 Actinic keratosis: Secondary | ICD-10-CM | POA: Diagnosis not present

## 2021-07-29 DIAGNOSIS — D225 Melanocytic nevi of trunk: Secondary | ICD-10-CM | POA: Diagnosis not present

## 2021-09-15 ENCOUNTER — Other Ambulatory Visit: Payer: Self-pay | Admitting: Orthopedic Surgery

## 2021-09-15 ENCOUNTER — Other Ambulatory Visit: Payer: Self-pay | Admitting: *Deleted

## 2021-09-15 ENCOUNTER — Ambulatory Visit
Admission: RE | Admit: 2021-09-15 | Discharge: 2021-09-15 | Disposition: A | Discharge status: Home / Self Care | Payer: BC Managed Care – PPO | Source: Ambulatory Visit | Attending: *Deleted | Admitting: *Deleted

## 2021-09-15 DIAGNOSIS — M1611 Unilateral primary osteoarthritis, right hip: Secondary | ICD-10-CM

## 2021-09-25 DIAGNOSIS — M79672 Pain in left foot: Secondary | ICD-10-CM | POA: Diagnosis not present

## 2021-09-25 DIAGNOSIS — M1611 Unilateral primary osteoarthritis, right hip: Secondary | ICD-10-CM | POA: Diagnosis not present

## 2021-10-14 ENCOUNTER — Other Ambulatory Visit: Payer: Self-pay | Admitting: Internal Medicine

## 2021-10-14 DIAGNOSIS — E7800 Pure hypercholesterolemia, unspecified: Secondary | ICD-10-CM

## 2021-10-30 DIAGNOSIS — M25562 Pain in left knee: Secondary | ICD-10-CM | POA: Diagnosis not present

## 2021-11-06 DIAGNOSIS — M25562 Pain in left knee: Secondary | ICD-10-CM | POA: Diagnosis not present

## 2021-11-26 DIAGNOSIS — X58XXXA Exposure to other specified factors, initial encounter: Secondary | ICD-10-CM | POA: Diagnosis not present

## 2021-11-26 DIAGNOSIS — S83242A Other tear of medial meniscus, current injury, left knee, initial encounter: Secondary | ICD-10-CM | POA: Diagnosis not present

## 2021-11-26 DIAGNOSIS — G8918 Other acute postprocedural pain: Secondary | ICD-10-CM | POA: Diagnosis not present

## 2021-11-26 DIAGNOSIS — S83232A Complex tear of medial meniscus, current injury, left knee, initial encounter: Secondary | ICD-10-CM | POA: Diagnosis not present

## 2021-11-26 DIAGNOSIS — Y999 Unspecified external cause status: Secondary | ICD-10-CM | POA: Diagnosis not present

## 2021-12-02 DIAGNOSIS — R531 Weakness: Secondary | ICD-10-CM | POA: Diagnosis not present

## 2021-12-02 DIAGNOSIS — M25662 Stiffness of left knee, not elsewhere classified: Secondary | ICD-10-CM | POA: Diagnosis not present

## 2021-12-09 DIAGNOSIS — M25662 Stiffness of left knee, not elsewhere classified: Secondary | ICD-10-CM | POA: Diagnosis not present

## 2021-12-09 DIAGNOSIS — R531 Weakness: Secondary | ICD-10-CM | POA: Diagnosis not present

## 2021-12-16 DIAGNOSIS — R531 Weakness: Secondary | ICD-10-CM | POA: Diagnosis not present

## 2021-12-16 DIAGNOSIS — M25662 Stiffness of left knee, not elsewhere classified: Secondary | ICD-10-CM | POA: Diagnosis not present

## 2021-12-23 DIAGNOSIS — R531 Weakness: Secondary | ICD-10-CM | POA: Diagnosis not present

## 2021-12-23 DIAGNOSIS — M25662 Stiffness of left knee, not elsewhere classified: Secondary | ICD-10-CM | POA: Diagnosis not present

## 2021-12-25 DIAGNOSIS — Z0000 Encounter for general adult medical examination without abnormal findings: Secondary | ICD-10-CM | POA: Diagnosis not present

## 2021-12-25 DIAGNOSIS — E7800 Pure hypercholesterolemia, unspecified: Secondary | ICD-10-CM | POA: Diagnosis not present

## 2021-12-25 DIAGNOSIS — Z01818 Encounter for other preprocedural examination: Secondary | ICD-10-CM | POA: Diagnosis not present

## 2021-12-25 DIAGNOSIS — Z125 Encounter for screening for malignant neoplasm of prostate: Secondary | ICD-10-CM | POA: Diagnosis not present

## 2022-01-12 ENCOUNTER — Other Ambulatory Visit: Payer: BC Managed Care – PPO

## 2022-01-12 ENCOUNTER — Ambulatory Visit
Admission: RE | Admit: 2022-01-12 | Discharge: 2022-01-12 | Disposition: A | Discharge status: Home / Self Care | Payer: No Typology Code available for payment source | Source: Ambulatory Visit | Attending: Internal Medicine | Admitting: Internal Medicine

## 2022-01-12 DIAGNOSIS — E7800 Pure hypercholesterolemia, unspecified: Secondary | ICD-10-CM

## 2022-01-13 ENCOUNTER — Other Ambulatory Visit: Payer: BC Managed Care – PPO

## 2022-01-19 DIAGNOSIS — E785 Hyperlipidemia, unspecified: Secondary | ICD-10-CM | POA: Diagnosis not present

## 2022-01-19 DIAGNOSIS — Z96641 Presence of right artificial hip joint: Secondary | ICD-10-CM | POA: Diagnosis not present

## 2022-01-19 DIAGNOSIS — Z471 Aftercare following joint replacement surgery: Secondary | ICD-10-CM | POA: Diagnosis not present

## 2022-01-19 DIAGNOSIS — I10 Essential (primary) hypertension: Secondary | ICD-10-CM | POA: Diagnosis not present

## 2022-01-19 DIAGNOSIS — M1611 Unilateral primary osteoarthritis, right hip: Secondary | ICD-10-CM | POA: Diagnosis not present

## 2022-01-22 DIAGNOSIS — Z96641 Presence of right artificial hip joint: Secondary | ICD-10-CM | POA: Diagnosis not present

## 2022-01-22 DIAGNOSIS — M25651 Stiffness of right hip, not elsewhere classified: Secondary | ICD-10-CM | POA: Diagnosis not present

## 2022-01-27 DIAGNOSIS — M25651 Stiffness of right hip, not elsewhere classified: Secondary | ICD-10-CM | POA: Diagnosis not present

## 2022-01-27 DIAGNOSIS — Z96641 Presence of right artificial hip joint: Secondary | ICD-10-CM | POA: Diagnosis not present

## 2022-01-28 DIAGNOSIS — Z96641 Presence of right artificial hip joint: Secondary | ICD-10-CM | POA: Diagnosis not present

## 2022-01-28 DIAGNOSIS — M25651 Stiffness of right hip, not elsewhere classified: Secondary | ICD-10-CM | POA: Diagnosis not present

## 2022-02-03 DIAGNOSIS — M25651 Stiffness of right hip, not elsewhere classified: Secondary | ICD-10-CM | POA: Diagnosis not present

## 2022-02-03 DIAGNOSIS — Z96641 Presence of right artificial hip joint: Secondary | ICD-10-CM | POA: Diagnosis not present
